# Patient Record
Sex: Male | Born: 1980
Health system: Southern US, Community
[De-identification: ages and names within clinical notes are randomized; demographics above are authoritative.]

## PROBLEM LIST (undated history)

## (undated) DIAGNOSIS — T7840XA Allergy, unspecified, initial encounter: Secondary | ICD-10-CM

## (undated) DIAGNOSIS — K469 Unspecified abdominal hernia without obstruction or gangrene: Secondary | ICD-10-CM

## (undated) HISTORY — PX: TONSILLECTOMY: SHX5217

## (undated) HISTORY — DX: Gilbert syndrome: E80.4

## (undated) HISTORY — PX: WISDOM TOOTH EXTRACTION: SHX21

## (undated) HISTORY — DX: Unspecified abdominal hernia without obstruction or gangrene: K46.9

## (undated) HISTORY — PX: TYMPANOSTOMY TUBE PLACEMENT: SHX32

## (undated) HISTORY — DX: Allergy, unspecified, initial encounter: T78.40XA

---

## 2004-02-07 HISTORY — PX: UVULECTOMY: SHX2631

## 2009-10-20 ENCOUNTER — Ambulatory Visit: Payer: Self-pay | Admitting: Family

## 2009-10-20 DIAGNOSIS — K4020 Bilateral inguinal hernia, without obstruction or gangrene, not specified as recurrent: Secondary | ICD-10-CM | POA: Insufficient documentation

## 2009-10-20 DIAGNOSIS — J309 Allergic rhinitis, unspecified: Secondary | ICD-10-CM | POA: Insufficient documentation

## 2009-10-25 ENCOUNTER — Encounter: Payer: Self-pay | Admitting: Family

## 2009-11-06 HISTORY — PX: HERNIA REPAIR: SHX51

## 2009-11-09 ENCOUNTER — Ambulatory Visit (HOSPITAL_COMMUNITY): Admission: RE | Admit: 2009-11-09 | Discharge: 2009-11-09 | Payer: Self-pay | Admitting: Surgery

## 2010-01-26 ENCOUNTER — Ambulatory Visit: Payer: Self-pay | Admitting: Family

## 2010-03-08 NOTE — Assessment & Plan Note (Signed)
Summary: NEW TO BE EST/MHF--Rm 4   Vital Signs:  Patient profile:   30 year old male Height:      74 inches Weight:      205.75 pounds BMI:     26.51 Temp:     98.3 degrees F oral Pulse rate:   72 / minute Pulse rhythm:   regular Resp:     16 per minute BP sitting:   122 / 78  (right arm) Cuff size:   large  Vitals Entered By: Mervin Kung CMA Duncan Dull) (October 20, 2009 1:54 PM) CC: Rm 4  New pt to establish care. Has had intermittent lower abdominal pressure x 2 weeks. Is Patient Diabetic? No Pain Assessment Patient in pain? yes     Location: lower abdomen Intensity: slight discomfort   CC:  Rm 4  New pt to establish care. Has had intermittent lower abdominal pressure x 2 weeks.Marland Kitchen  History of Present Illness: Jon Palmer is a 30 year old male who presents today to establish care. He was previoulsy followed by [facility], but wishes to transfer his care.  He has several concerns today.  1)Abdominal discomfort- Pt notes of lower abdominal pressure for 3-4 days.  Worse if laughing/coughing.  Denies dysuria, slight urinary frequency. Started back to the gym 2 months ago.  BM's have been 4x a day recently- normal BM's 2-3 x a day.  No black or bloody stools, No fever, no nausea or vomitting.    2)Allergic Rhinitis-  had allergy shots as a child.  Now takes flonase and zyrtec with good control.   Preventive Screening-Counseling & Management  Alcohol-Tobacco     Alcohol drinks/day: 1 glass wine every other day     Smoking Status: quit     Year Quit: 2010     Pack years: 9  Caffeine-Diet-Exercise     Caffeine use/day: 2 teas daily     Does Patient Exercise: yes     Type of exercise: cardio     Exercise (avg: min/session): 30-60     Times/week: 3  Allergies (verified): 1)  ! 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 Resp: Denies cough CV:  Denies Chest Pain or shortness of breath GI:  Denies nausea or vomitting GU: Denies dysuria Lymphatic: Denies lymphadenopathy Musculoskeletal:  Denies muscle/joint pain Skin:  Denies Rashes- has mole on his back- has been there for years Psychiatric: college- underwent therapy.   Neuro: Denies numbness      Physical Exam  General:  Well-developed,well-nourished,in no acute distress; alert,appropriate and cooperative throughout examination Lungs:  Normal respiratory effort, chest expands symmetrically. Lungs are clear to auscultation, no crackles or wheezes. Heart:  Normal rate and regular rhythm. S1 and S2 normal without gallop, murmur, click, rub or other extra sounds. Abdomen:  Mild abdominal tenderness to palpation noted on right mid abdomen.  No guarding, no rebound tenderness, + BP noted.  Abdomen is soft and non-distended (not acute abdomen) Genitalia:  bilateral inguinal hernia noted in inguinal canals bilaterally on exam with cough  L>R Psych:  Cognition and judgment appear intact. Alert and cooperative with normal attention span and concentration. No apparent delusions, illusions, hallucinations   Impression & Recommendations:  Problem # 1:  INGUINAL HERNIAS, BILATERAL (ICD-550.92) Assessment Comment Only  Will refer to surgery for further evaluation.  Pt was  instructed to avoid heavy lifing.  Call if worsening abdominal pain, nausea, vomitting or fever.    Orders: Surgical Referral (Surgery)  Problem # 2:  ALLERGIC RHINITIS (ICD-477.9) Assessment: Unchanged Refills provided on flonase, continue Zyrtec. His updated medication list for this problem includes:    Flonase 50 Mcg/act Susp (Fluticasone propionate) .Marland Kitchen... 1 spray each nostril at bedtime.    Zyrtec Allergy 10 Mg Tabs (Cetirizine hcl) .Marland Kitchen... Take 1 tablet by mouth once a day.  Complete Medication List: 1)  Flonase 50 Mcg/act Susp (Fluticasone propionate) .Marland Kitchen.. 1 spray each nostril at bedtime. 2)  Zyrtec Allergy 10 Mg Tabs (Cetirizine hcl) .... Take 1 tablet by mouth once a day.  Patient Instructions: 1)  You will be contacted about your referral to see the Surgeon. 2)  Call us if you develop worsening abdominal pain, nausea, vomitting, fever or malaise-  Go to ER if severe. 3)  Please schedule a follow up appointment for a complete physical, come fasting to this appointment. 4)  Welcome to [facility], it was a pleasure to meet you. Prescriptions: FLONASE 50 MCG/ACT SUSP (FLUTICASONE PROPIONATE) 1 spray each nostril at bedtime.  #1 x 5   Entered and Authorized by:   Lemont Fillers FNP   Signed by:   Lemont Fillers FNP on 10/20/2009   Method used:   Electronically to        [facility] # [phone]* (retail)       [address] [place], [place]  [address]       Ph: [phone]       Fax: [phone]   RxID:   [id number]   Current Allergies (reviewed today): ! PCN   Preventive Care Screening  Last Tetanus Booster:    Date:  02/07/2004    Results:  Historical      Pt states he will get flu shot at work. Nicki Guadalajara Fergerson CMA Duncan Dull)  October 20, 2009 2:07 PM

## 2010-03-08 NOTE — Consult Note (Signed)
Summary: [facility]   Imported By: Lanelle Bal 11/11/2009 08:09:12  _____________________________________________________________________  External Attachment:    Type:   Image     Comment:   External Document

## 2010-03-10 NOTE — Assessment & Plan Note (Signed)
Summary: hives/mhf--Rm 5   Vital Signs:  Patient profile:   30 year old male Height:      74 inches Weight:      214 pounds BMI:     27.58 Temp:     98.1 degrees F oral Pulse rate:   66 / minute Pulse rhythm:   regular Resp:     16 per minute BP sitting:   110 / 78  (right arm) Cuff size:   large  Vitals Entered By: Mervin Kung CMA Duncan Dull) (January 26, 2010 8:45 AM) CC: Pt states he has had red, itchy rash since yesterday. Is Patient Diabetic? No Pain Assessment Patient in pain? no      Comments Pt has tried Benadryl every 6 hours for rash. Nicki Guadalajara Fergerson CMA Duncan Dull)  January 26, 2010 8:51 AM    Primary Care Emiliya Chretien:  Jon Palmer  CC:  Pt states he has had red and itchy rash since yesterday.Marland Kitchen  History of Present Illness: Jon Palmer is a 30 year old male who presents with 48 hour history of hives.  He has continued zyrtec and used Benadryl which has improved itching, but has not "cleared up".  First noticed itching yesterday morning. He had chicken pot pie for dinner night before.  Had captain crunch for breakfast.  No new detergents or soap.  Denies associated SOB, tongue, lip swelling, throat swelling or wheezing.  Did have a bad nose bleed day before.  Was using [facility], and switched to aveeno for 2 weeks prior.  Denies known history of allergy to bee stings or food.  Only known allergy is benadryl.  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    Allergies: 1)  ! Pcn  Past History:  Past Medical History: Last updated: 10/20/2009 allergic rhinitis hernia as infant  Past Surgical History: Last updated: 10/20/2009 Tonsillectomy-during college uvulectomy 2006 wisdom teeth extraction hernia repair ear tubes as child  Review of Systems       see HPI  Physical Exam  General:  Well-developed,well-nourished,in no acute distress;  alert,appropriate and cooperative throughout examination Head:  Normocephalic and atraumatic without obvious abnormalities. No apparent alopecia or balding. Mouth:  Oral mucosa and oropharynx without lesions or exudates. No tongue or lip swelling  Teeth in good repair. Skin:  + urticarial rash noted on left side of neck and bilateral palmar surfaces.   Impression & Recommendations:  Problem # 1:  URTICARIA (ICD-708.9) Assessment New Pt given dose of solumedrol today in the office.  Will plan to treat with prednisone taper and benadryl.  No sign of respiratory compromise, tongue/lip swelling.   Pt was instructed to go to the ED if any of these symptoms occur.   Solumedrol up to 125mg  (Z6109) Admin of Therapeutic Inj  intramuscular or subcutaneous (60454)  Complete Medication List: 1)  Flonase 50 Mcg/act Susp (Fluticasone propionate) .Marland Kitchen.. 1 spray each nostril at bedtime. 2)  Zyrtec Allergy 10 Mg Tabs (Cetirizine hcl) .... Take 1 tablet by mouth once a day. 3)  Prednisone 10 Mg Tabs (Prednisone) .... Take 4 tablets daily x 2 days, then 3 tabs daily x 2 days, then 2 tabs daily x 2 days, then 1 tab daily for 2 days then stop. 4)  Diphenhydramine Hcl 25 Mg Caps (Diphenhydramine hcl) .... One tablet by mouth every 6 hours as needed for itching  Patient Instructions: 1)  Go to the ER if you develop lip, tongue swelling, shortness of breath. 2)  Call if your symptoms are not resolved in 48-72 hours.   Prescriptions: PREDNISONE 10 MG TABS (PREDNISONE) take 4 tablets daily x 2 days, then 3 tabs daily x 2 days, then 2 tabs daily x 2 days, then 1 tab daily for 2 days then stop.  #20 x 0   Entered and Authorized by:   Jon Palmer   Signed by:   Jon Palmer on 01/26/2010   Method used:   Electronically to        [facility] # [phone]* (retail)       [address] [place], [place]  [address]       Ph: [phone]       Fax: [phone]   RxID:    [id number]    Medication Administration  Injection # 1:    Medication: Solumedrol up to 125mg     Diagnosis: URTICARIA (ICD-708.9)    Route: IM    Site: LUOQ gluteus    Exp Date: 05/06/2012    Lot #: [id number]    Mfr: Pharmacia    Patient tolerated injection without complications    Given by: Mervin Kung CMA Duncan Dull) (January 26, 2010 9:20 AM)  Orders Added: 1)  Solumedrol up to 125mg  [J2930] 2)  Admin of Therapeutic Inj  intramuscular or subcutaneous [96372] 3)  Est. Patient Level III [24401]    Current Allergies (reviewed today): ! PCN

## 2010-03-29 ENCOUNTER — Encounter: Payer: Self-pay | Admitting: Family

## 2010-03-29 ENCOUNTER — Encounter (INDEPENDENT_AMBULATORY_CARE_PROVIDER_SITE_OTHER): Payer: BC Managed Care – PPO | Admitting: Family

## 2010-03-29 DIAGNOSIS — S239XXA Sprain of unspecified parts of thorax, initial encounter: Secondary | ICD-10-CM

## 2010-03-29 DIAGNOSIS — J309 Allergic rhinitis, unspecified: Secondary | ICD-10-CM

## 2010-03-29 DIAGNOSIS — Z0000 Encounter for general adult medical examination without abnormal findings: Secondary | ICD-10-CM

## 2010-03-29 LAB — CONVERTED CEMR LAB
Albumin: 5.1 g/dL (ref 3.5–5.2)
Alkaline Phosphatase: 66 units/L (ref 39–117)
Basophils Absolute: 0 10*3/uL (ref 0.0–0.1)
CO2: 27 meq/L (ref 19–32)
Chloride: 103 meq/L (ref 96–112)
Creatinine, Ser: 0.96 mg/dL (ref 0.40–1.50)
HDL: 59 mg/dL (ref >39)
HIV: NONREACTIVE
Hemoglobin: 14.5 g/dL (ref 13.0–17.0)
LDL Cholesterol: 94 mg/dL (ref 0–99)
Lymphocytes Relative: 45 % (ref 12–46)
Monocytes Absolute: 0.5 10*3/uL (ref 0.1–1.0)
Neutro Abs: 2.3 10*3/uL (ref 1.7–7.7)
RBC: 5.09 M/uL (ref 4.22–5.81)
RDW: 13.3 % (ref 11.5–15.5)
TSH: 1.523 microintl units/mL (ref 0.350–4.500)
Total Bilirubin: 1.5 mg/dL — ABNORMAL HIGH (ref 0.3–1.2)
Total CHOL/HDL Ratio: 2.8
WBC: 5.2 10*3/uL (ref 4.0–10.5)

## 2010-03-30 ENCOUNTER — Telehealth: Payer: Self-pay | Admitting: Family

## 2010-03-30 HISTORY — DX: Gilbert syndrome: E80.4
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
## 2010-04-05 NOTE — Progress Notes (Signed)
  Phone Note Outgoing Call   Summary of Call: Spoke with patient, reviewed lab work.  Pt instructed to go to lab in 1 month for repeat LFTs.  If bili still high, will plan additional work up. Initial call taken by: Lemont Fillers FNP,  March 30, 2010 10:21 AM  New Problems: HYPERBILIRUBINEMIA (ICD-782.4)   New Problems: HYPERBILIRUBINEMIA (ICD-782.4)

## 2010-04-21 LAB — DIFFERENTIAL
Basophils Absolute: 0 10*3/uL (ref 0.0–0.1)
Basophils Relative: 0 % (ref 0–1)
Eosinophils Relative: 1 % (ref 0–5)
Lymphocytes Relative: 44 % (ref 12–46)
Monocytes Absolute: 0.6 10*3/uL (ref 0.1–1.0)

## 2010-04-21 LAB — COMPREHENSIVE METABOLIC PANEL
AST: 30 U/L (ref 0–37)
Albumin: 4.2 g/dL (ref 3.5–5.2)
Alkaline Phosphatase: 58 U/L (ref 39–117)
Chloride: 104 mEq/L (ref 96–112)
GFR calc Af Amer: >60 mL/min (ref >60)
Potassium: 4.1 mEq/L (ref 3.5–5.1)
Total Bilirubin: 0.8 mg/dL (ref 0.3–1.2)

## 2010-04-21 LAB — CBC
Platelets: 192 10*3/uL (ref 150–400)
RBC: 4.93 MIL/uL (ref 4.22–5.81)
WBC: 5.7 10*3/uL (ref 4.0–10.5)

## 2010-05-11 ENCOUNTER — Encounter: Payer: Self-pay | Admitting: Family

## 2010-05-11 ENCOUNTER — Ambulatory Visit (INDEPENDENT_AMBULATORY_CARE_PROVIDER_SITE_OTHER): Payer: BC Managed Care – PPO | Admitting: Family

## 2010-05-11 ENCOUNTER — Ambulatory Visit (HOSPITAL_BASED_OUTPATIENT_CLINIC_OR_DEPARTMENT_OTHER)
Admission: RE | Admit: 2010-05-11 | Discharge: 2010-05-11 | Disposition: A | Discharge status: Home / Self Care | Payer: BC Managed Care – PPO | Source: Ambulatory Visit | Attending: Family | Admitting: Family

## 2010-05-11 VITALS — BP 94/68 | HR 62 | Temp 97.9°F | Resp 18 | Wt 215.0 lb

## 2010-05-11 DIAGNOSIS — S59909A Unspecified injury of unspecified elbow, initial encounter: Secondary | ICD-10-CM

## 2010-05-11 DIAGNOSIS — S6991XA Unspecified injury of right wrist, hand and finger(s), initial encounter: Secondary | ICD-10-CM | POA: Insufficient documentation

## 2010-05-11 DIAGNOSIS — S59919A Unspecified injury of unspecified forearm, initial encounter: Secondary | ICD-10-CM

## 2010-05-11 DIAGNOSIS — M25539 Pain in unspecified wrist: Secondary | ICD-10-CM | POA: Insufficient documentation

## 2010-05-11 NOTE — Progress Notes (Signed)
Pt notified and voices understanding. 

## 2010-05-11 NOTE — Patient Instructions (Signed)
Please complete your x-ray this afternoon on the first floor. We will call you with your results.

## 2010-05-11 NOTE — Assessment & Plan Note (Addendum)
X-ray performed today to rule out fracture.  Negative for fracture.  Sxs most consistent with wrist sprain.  Rec ACE wrap, ice, aleve PRN.
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
## 2010-06-08 ENCOUNTER — Ambulatory Visit (HOSPITAL_BASED_OUTPATIENT_CLINIC_OR_DEPARTMENT_OTHER)
Admission: RE | Admit: 2010-06-08 | Discharge: 2010-06-08 | Disposition: A | Discharge status: Home / Self Care | Payer: BC Managed Care – PPO | Source: Ambulatory Visit | Attending: Family | Admitting: Family

## 2010-06-08 ENCOUNTER — Telehealth: Payer: Self-pay | Admitting: Family

## 2010-06-08 ENCOUNTER — Encounter: Payer: Self-pay | Admitting: Family

## 2010-06-08 ENCOUNTER — Ambulatory Visit (INDEPENDENT_AMBULATORY_CARE_PROVIDER_SITE_OTHER): Payer: BC Managed Care – PPO | Admitting: Family

## 2010-06-08 VITALS — BP 118/70 | HR 72 | Temp 98.2°F | Resp 16 | Ht 74.02 in | Wt 219.0 lb

## 2010-06-08 DIAGNOSIS — R1031 Right lower quadrant pain: Secondary | ICD-10-CM

## 2010-06-08 DIAGNOSIS — R109 Unspecified abdominal pain: Secondary | ICD-10-CM | POA: Insufficient documentation

## 2010-06-08 MED ORDER — IOHEXOL 300 MG/ML  SOLN
100.0000 mL | Freq: Once | INTRAMUSCULAR | Status: AC | PRN
  Administered 2010-06-08: 100 mL via INTRAVENOUS

## 2010-06-08 NOTE — Progress Notes (Signed)
  Subjective:    Patient ID: Jon Palmer, male    DOB: 12-15-1980, 30 y.o.   MRN: [id number]  HPI  [name] is a 30 yr old male who presents with complaint of right lower quadrant pain since Friday.  Pain waxes and wanes.  Hx bilateral inguinal hernia repair in October.  Denies associated nausea, vomitting, constipation, diarrhea or fever.  He reports similar pain in the past prior to his hernia repair.     Review of Systems Past Medical History  Diagnosis Date  . Allergy     allergic rhinitis  . Hernia     as an infant    History   Social History  . Marital Status: Married    Spouse Name: N/A    Number of Children: 0  . 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 Smoking status: Former Games developer  . Smokeless tobacco: Not on file  . Alcohol Use: Yes     1-2 beers some days  . Drug Use: Not on file  . Sexually Active: Not on file   Other Topics Concern  . Not on file   Social History Narrative   regular exercise: yesMale partner    Past Surgical History  Procedure Date  . Tonsillectomy     during college  . Uvulectomy 2006  . Hernia repair 11/2009    bilateral  . Tympanostomy tube placement     as a child  . Wisdom tooth extraction     Family History  Problem Relation Age of Onset  . Hyperlipidemia Mother   . Hypertension Mother   . Hyperlipidemia Father   . Hypertension Father   . Cancer Father     bladder  . Heart disease Maternal Grandmother     multiple heart bypasses  . Heart attack Maternal Grandfather   . Heart disease Paternal Grandmother     multiple heart bypasses    Allergies  Allergen Reactions  . Penicillins     REACTION: hives    Current Outpatient Prescriptions on File Prior to Visit  Medication Sig Dispense Refill  . cetirizine (ZYRTEC) 10 MG tablet Take 10 mg by mouth daily.        . fluticasone (FLONASE) 50 MCG/ACT nasal spray 2 sprays by Each Nare  route at bedtime.        . naproxen sodium (ALEVE) 220 MG tablet Take 220 mg by mouth 2 (two) times daily with a meal. As needed for back pain         BP 118/70  Pulse 72  Temp(Src) 98.2 F (36.8 C) (Oral)  Resp 16  Ht 6' 2.02" (1.88 m)  Wt 219 lb 0.6 oz (99.356 kg)  BMI 28.11 kg/m2       Objective:   Physical Exam  Constitutional: He appears well-developed and well-nourished.  Cardiovascular: Normal rate and regular rhythm.   Pulmonary/Chest: Effort normal and breath sounds normal.  Abdominal: Soft. He exhibits no mass. There is no tenderness.  Genitourinary: Penis normal.       No palpable inguinal hernias.  Skin: Skin is warm and dry.          Assessment & Plan:

## 2010-06-08 NOTE — Telephone Encounter (Signed)
Called patient and reviewed results of CT scan.

## 2010-06-08 NOTE — Assessment & Plan Note (Signed)
29 yr old male presents with RLQ abdominal pain, s/p bilateral IHR 6 mos ago.  Need to exclude recurrent hernia or appendicitis.  Will refer for CT abdomen.

## 2010-06-08 NOTE — Patient Instructions (Signed)
Please complete your CT on the first floor today- we will contact you with the results.

## 2010-10-21 ENCOUNTER — Telehealth: Payer: Self-pay | Admitting: Family

## 2010-10-21 NOTE — Telephone Encounter (Signed)
Error

## 2010-10-26 ENCOUNTER — Telehealth: Payer: Self-pay | Admitting: Family

## 2010-10-26 MED ORDER — FLUTICASONE PROPIONATE 50 MCG/ACT NA SUSP: 2.0000 | Freq: Every day | NASAL | Status: DC

## 2010-10-26 NOTE — Telephone Encounter (Signed)
Refill- fluticasone prop spray. Use one spray in each nostril at bedtime. Qty 16.0gm. Last fill 4.1.12

## 2010-10-26 NOTE — Telephone Encounter (Signed)
Refill sent to pharmacy.   

## 2011-01-17 ENCOUNTER — Other Ambulatory Visit: Payer: Self-pay | Admitting: Family

## 2011-05-08 ENCOUNTER — Encounter: Payer: Self-pay | Admitting: Family

## 2011-05-08 ENCOUNTER — Ambulatory Visit (INDEPENDENT_AMBULATORY_CARE_PROVIDER_SITE_OTHER): Payer: BC Managed Care – PPO | Admitting: Family

## 2011-05-08 VITALS — BP 114/64 | HR 73 | Temp 97.9°F | Resp 16 | Ht 74.0 in | Wt 219.0 lb

## 2011-05-08 DIAGNOSIS — R1030 Lower abdominal pain, unspecified: Secondary | ICD-10-CM

## 2011-05-08 DIAGNOSIS — R109 Unspecified abdominal pain: Secondary | ICD-10-CM

## 2011-05-08 DIAGNOSIS — N419 Inflammatory disease of prostate, unspecified: Secondary | ICD-10-CM | POA: Insufficient documentation

## 2011-05-08 MED ORDER — FLUTICASONE PROPIONATE 50 MCG/ACT NA SUSP: 2.0000 | Freq: Every day | NASAL | Status: DC

## 2011-05-08 NOTE — Progress Notes (Signed)
Subjective:    Patient ID: Jon Palmer, male    DOB: 02-08-1980, 30 y.o.   MRN: [id number]  HPI  [name] is a 31 yr old male who presents with complaint of bilateral groin pain. He has hx of bilateral inguinal hernia repair in 2011. He denies problems with nausea/vomitting/abdominal pain. BM's are reported as normal.  Pain is worst with sitting and with leaning forward.  Symptoms started acting back up again a few months ago. Denies any masses or notable bulging.   Review of Systems See HPI  Past Medical History  Diagnosis Date  . Allergy     allergic rhinitis  . Hernia     as an infant    History   Social History  . Marital Status: Married    Spouse Name: N/A    Number of Children: 0  . 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 Smoking status: Former Games developer  . Smokeless tobacco: Not on file  . Alcohol Use: Yes     1-2 beers some days  . Drug Use: Not on file  . Sexually Active: Not on file   Other Topics Concern  . Not on file   Social History Narrative   regular exercise: yesMale partner    Past Surgical History  Procedure Date  . Tonsillectomy     during college  . Uvulectomy 2006  . Hernia repair 11/2009    bilateral  . Tympanostomy tube placement     as a child  . Wisdom tooth extraction     Family History  Problem Relation Age of Onset  . Hyperlipidemia Mother   . Hypertension Mother   . Hyperlipidemia Father   . Hypertension Father   . Cancer Father     bladder  . Heart disease Maternal Grandmother     multiple heart bypasses  . Heart attack Maternal Grandfather   . Heart disease Paternal Grandmother     multiple heart bypasses    Allergies  Allergen Reactions  . Penicillins     REACTION: hives    Current Outpatient Prescriptions on File Prior to Visit  Medication Sig Dispense Refill  . cetirizine (ZYRTEC) 10 MG tablet Take 10 mg by mouth daily.          Marland Kitchen DISCONTD: fluticasone (FLONASE) 50 MCG/ACT nasal spray PLACE 2 SPRAYS INTO THE NOSE AT BEDTIME.  16 g  1  . naproxen sodium (ALEVE) 220 MG tablet Take 220 mg by mouth 2 (two) times daily with a meal. As needed for back pain         BP 114/64  Pulse 73  Temp(Src) 97.9 F (36.6 C) (Oral)  Resp 16  Ht 6\' 2"  (1.88 m)  Wt 219 lb (99.338 kg)  BMI 28.12 kg/m2  SpO2 99%       Objective:   Physical Exam  Constitutional: He appears well-developed and well-nourished.  Cardiovascular: Normal rate and regular rhythm.   No murmur heard. Pulmonary/Chest: Effort normal and breath sounds normal. No respiratory distress. He has no wheezes. He has no rales. He exhibits no tenderness.  Abdominal: Soft. Bowel sounds are normal. He exhibits no distension.         Mild left groin tenderness to palpation.   Psychiatric: He has a normal mood and affect. His behavior is normal. Judgment and thought content normal. Cognition and memory are normal.  Assessment & Plan:

## 2011-05-08 NOTE — Assessment & Plan Note (Signed)
Will refer back to Dr. Mignon Pine, surgeon who preformed his hernia repair in 2011 for re-evaluation.  We discussed signs/symptoms or incarcerated hernia and he is instructed to go to the ED if they occurs.

## 2011-05-08 NOTE — Patient Instructions (Signed)
You will be contacted about your referral to Dr. Mignon Pine.  Please schedule a fasting physical.

## 2011-05-17 ENCOUNTER — Ambulatory Visit (INDEPENDENT_AMBULATORY_CARE_PROVIDER_SITE_OTHER): Payer: BC Managed Care – PPO | Admitting: Family

## 2011-05-17 ENCOUNTER — Encounter: Payer: Self-pay | Admitting: Family

## 2011-05-17 ENCOUNTER — Ambulatory Visit (HOSPITAL_BASED_OUTPATIENT_CLINIC_OR_DEPARTMENT_OTHER)
Admission: RE | Admit: 2011-05-17 | Discharge: 2011-05-17 | Disposition: A | Discharge status: Home / Self Care | Payer: BC Managed Care – PPO | Source: Ambulatory Visit | Attending: Family | Admitting: Family

## 2011-05-17 ENCOUNTER — Telehealth: Payer: Self-pay | Admitting: Family

## 2011-05-17 VITALS — BP 130/80 | HR 88 | Temp 97.9°F | Resp 16 | Wt 217.0 lb

## 2011-05-17 DIAGNOSIS — R109 Unspecified abdominal pain: Secondary | ICD-10-CM

## 2011-05-17 DIAGNOSIS — R1030 Lower abdominal pain, unspecified: Secondary | ICD-10-CM

## 2011-05-17 DIAGNOSIS — N419 Inflammatory disease of prostate, unspecified: Secondary | ICD-10-CM

## 2011-05-17 DIAGNOSIS — Z9889 Other specified postprocedural states: Secondary | ICD-10-CM

## 2011-05-17 DIAGNOSIS — R300 Dysuria: Secondary | ICD-10-CM

## 2011-05-17 DIAGNOSIS — N289 Disorder of kidney and ureter, unspecified: Secondary | ICD-10-CM

## 2011-05-17 LAB — POCT URINALYSIS DIPSTICK
Bilirubin, UA: NEGATIVE
Glucose, UA: NEGATIVE
Ketones, UA: NEGATIVE
Leukocytes, UA: NEGATIVE
Nitrite, UA: NEGATIVE
Protein, UA: NEGATIVE
Spec Grav, UA: 1.015
Urobilinogen, UA: 0.2
pH, UA: 7

## 2011-05-17 MED ORDER — IOHEXOL 300 MG/ML  SOLN: 100.0000 mL | Freq: Once | INTRAMUSCULAR | Status: AC | PRN

## 2011-05-17 MED ORDER — CIPROFLOXACIN HCL 500 MG PO TABS: 500.0000 mg | ORAL_TABLET | Freq: Two times a day (BID) | ORAL | Status: AC

## 2011-05-17 NOTE — Progress Notes (Signed)
Subjective:    Patient ID: Jon Palmer, male    DOB: Apr 08, 1980, 31 y.o.   MRN: [id number]  HPI  Jon Palmer is a 31 yr old male who presents today for follow up of his groin pain.  He was initially evaluated on 05/08/11 due to this discomfort.  A referral was made back to his surgeon who performed his inguinal hernia repair in 2011.  He is scheduled to meet with Dr. Mignon Pine on 4/18.  Since his last visit, he reports that the pain has worsened and become constant.  Reports pain in the groin- at the top of the legs.  L>R.  Pain radiates up both sides of his abdomen.  In addition, he reports that he has developed nausea.  Stools have been soft and more frequent than normal.   Review of Systems See HPI  Past Medical History  Diagnosis Date  . Allergy     allergic rhinitis  . Hernia     as an infant    History   Social History  . Marital Status: Married    Spouse Name: N/A    Number of Children: 0  . 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 Smoking status: Former Games developer  . Smokeless tobacco: Not on file  . Alcohol Use: Yes     1-2 beers some days  . Drug Use: Not on file  . Sexually Active: Not on file   Other Topics Concern  . Not on file   Social History Narrative   regular exercise: yesMale partner    Past Surgical History  Procedure Date  . Tonsillectomy     during college  . Uvulectomy 2006  . Hernia repair 11/2009    bilateral  . Tympanostomy tube placement     as a child  . Wisdom tooth extraction     Family History  Problem Relation Age of Onset  . Hyperlipidemia Mother   . Hypertension Mother   . Hyperlipidemia Father   . Hypertension Father   . Cancer Father     bladder  . Heart disease Maternal Grandmother     multiple heart bypasses  . Heart attack Maternal Grandfather   . Heart disease Paternal Grandmother     multiple heart bypasses    Allergies  Allergen  Reactions  . Penicillins     REACTION: hives    Current Outpatient Prescriptions on File Prior to Visit  Medication Sig Dispense Refill  . cetirizine (ZYRTEC) 10 MG tablet Take 10 mg by mouth daily.        . fluticasone (FLONASE) 50 MCG/ACT nasal spray Place 2 sprays into the nose daily.  16 g  3  . naproxen sodium (ALEVE) 220 MG tablet Take 220 mg by mouth 2 (two) times daily with a meal. As needed for back pain         BP 130/80  Pulse 88  Temp(Src) 97.9 F (36.6 C) (Oral)  Resp 16  Wt 217 lb 0.6 oz (98.449 kg)  SpO2 99%       Objective:   Physical Exam  Constitutional: He appears well-developed and well-nourished. No distress.  Cardiovascular: Normal rate and regular rhythm.   No murmur heard. Pulmonary/Chest: Effort normal and breath sounds normal. No respiratory distress. He has no wheezes. He has no rales. He exhibits no tenderness.  Abdominal: Hernia confirmed negative in the right inguinal  area and confirmed negative in the left inguinal area.  Genitourinary: Penis normal. Circumcised.  Lymphadenopathy:       Right: No inguinal adenopathy present.       Left: No inguinal adenopathy present.  Psychiatric:       Pleasant, tearful briefly during interview upon discussing recent stress.           Assessment & Plan:

## 2011-05-17 NOTE — Patient Instructions (Addendum)
Prostatitis Prostatitis is an inflammation (the body's way of reacting to injury and/or infection) of the prostate gland. The prostate gland is a male organ. The gland is about the size and shape of a walnut. The prostate is located just below the bladder. It produces semen, which is a fluid that helps nourish and transport sperm. Prostatitis is the most common urinary tract problem in men younger than age 31. There are 4 categories of prostatitis:  I - Acute bacterial prostatitis.   II - Chronic bacterial prostatitis.   III - Chronic prostatitis and chronic pelvic pain syndrome (CPPS).   Inflammatory.   Non inflammatory.   IV - Asymptomatic inflammatory prostatitis.  Acute and chronic bacterial prostatitis are problems with bacterial infections of the prostate. "Acute" infection is usually a one-time problem. "Chronic" bacterial prostatitis is a condition with recurrent infection. It is usually caused by the same germ(bacteria). CPPS has symptoms similar to prostate infection. However, no infection is actually found. This condition can cause problems of ongoing pain. Currently, it cannot be cured. Treatments are available and aimed at symptom control.  Asymptomatic inflammatory prostatitis has no symptoms. It is a condition where infection-fighting cells are found by chance in the urine. The diagnosis is made most often during an exam for other conditions. Other conditions could be infertility or a high level of PSA (prostate-specific antigen) in the blood. SYMPTOMS  Symptoms can vary depending upon the type of prostatitis that exists. There can also be overlap in symptoms. This can make diagnosis difficult. Symptoms: For Acute bacterial prostatitis  Painful urination.   Fever or chills.   Muscle or joint pains.   Low back pain.   Low abdominal pain.   Inability to empty bladder completely.   Sudden urges to urinate.   Frequent urination during the day.   Difficulty starting  urine stream.   Need to urinate several times at night (nocturia).   Weak urine stream.   Urethral (tube that carries urine from the bladder out of the body) discharge and dribbling after urination.  For Chronic bacterial prostatitis  Rectal pain.   Pain in the testicles, penis, or tip of the penis.   Pain in the space between the anus and scrotum (perineum).   Low back pain.   Low abdominal pain.   Problems with sexual function.   Painful ejaculation.   Bloody semen.   Inability to empty bladder completely.   Painful urination.   Sudden urges to urinate.   Frequent urination during the day.   Difficulty starting urine stream.   Need to urinate several times at night (nocturia).   Weak urine stream.   Dribbling after urination.   Urethral discharge.  For Chronic prostatitis and chronic pelvic pain syndrome (CPPS) Symptoms are the same as those for chronic bacterial prostatitis. Problems with sexual function are often the reason for seeking care. This important problem should be discussed with your caregiver. For Asymptomatic inflammatory prostatitis As noted above, there are no symptoms with this condition. DIAGNOSIS   Your caregiver may perform a rectal exam. This exam is to determine if the prostate is swollen and tender.   Sometimes blood work is performed. This is done to see if your white blood cell count is elevated. The Prostate Specific Antigen (PSA) is also measured. PSA is a blood test that can help detect early prostate cancer.   A urinalysis is done to find out what type of infection is present if this is a suspected cause. An   additional urinalysis may be done after a digital rectal exam. This is to see if white blood cells are pushed out of the prostate and into the urine. A low-grade infection of the prostate may not be found on the first urinalysis.  In more difficult cases, your caregiver may advise other tests. Tests could include:  Urodynamics  -- Tests the function of the bladder and the organs involved in triggering and controlling normal urination.   Urine flow rate.   Cystoscopy -- In this procedure, a thin, telescope-like tube with a light and tiny camera attached (cystoscope) is inserted into the bladder through the urethra. This allows the caregiver to see the inside of the urethra and bladder.   Electromyography -- This procedure tests how the muscles and nerves of the bladder work. It is focused on the muscles that control the anus and pelvic floor. These are the muscles between the anus and scrotum.  In people who show no signs of infection, certain uncommon infections might be causing constant or recurrent symptoms. These uncommon infections are difficult to detect. More work in medicine may help find solutions to these problems. TREATMENT  Antibiotics are used to treat infections caused by germs. If the infection is not treated and becomes long lasting (chronic), it may become a lower grade infection with minor, continual problems. Without treatment, the prostate may develop a boil or furuncle (abscess). This may require surgical treatment. For those with chronic prostatitis and CPPS, it is important to work closely with your primary caregiver and urologist. For some, the medicines that are used to treat a non-cancerous, enlarged prostate (benign prostatic hypertrophy) may be helpful. Referrals to specialists other than urologists may be necessary. In rare cases when all treatments have been inadequate for pain control, an operation to remove the prostate may be recommended. This is very rare and before this is considered thorough discussion with your urologist is highly recommended.  In cases of secondary to chronic non-bacterial prostatitis, a good relationship with your urologist or primary caregiver is essential because it is often a recurrent prolonged condition that requires a good understanding of the causes and a commitment  to therapy aimed at controlling your symptoms. HOME CARE INSTRUCTIONS   Hot sitz baths for 20 minutes, 4 times per day, may help relieve pain.   Non-prescription pain killers may be used as your caregiver recommends if you have no allergies to them. Some illnesses or conditions prevent use of non-prescription drugs. If unsure, check with your caregiver. Take all medications as directed. Take the antibiotics for the prescribed length of time, even if you are feeling better.  SEEK MEDICAL CARE IF:   You have any worsening of the symptoms that originally brought you to your caregiver.   You have an oral temperature above 102 F (38.9 C).   You experience any side effects from medications prescribed.  SEEK IMMEDIATE MEDICAL CARE IF:   You have an oral temperature above 102 F (38.9 C), not controlled by medicine.   You have pain not relieved with medications.   You develop nausea, vomiting, lightheadedness, or have a fainting episode.   You are unable to urinate.   You pass bloody urine or clots.  Document Released: 01/21/2000 Document Revised: 01/12/2011 Document Reviewed: 12/26/2010 [employer] Patient Information 2012 [employer]. 

## 2011-05-17 NOTE — Telephone Encounter (Signed)
Lab will be unable to add GC/chlamydia to urine culture. Will call pt to return to the lab for additional specimen.

## 2011-05-18 LAB — URINE CULTURE

## 2011-05-18 LAB — GC/CHLAMYDIA PROBE AMP, URINE: Chlamydia, Swab/Urine, PCR: NEGATIVE

## 2011-05-18 NOTE — Assessment & Plan Note (Signed)
Due to ongoing complaints of lower abdominal discomfort and now nausea, a CT abd/pelvis was obtained and was unremarkable.  Specifically, it is neg for inguinal hernias.  I suspect prostatitis.  GC/Chlamydia is neg.  Pt is pen allergic.  Will plan to treat with cipro.

## 2011-05-19 ENCOUNTER — Encounter: Payer: Self-pay | Admitting: Family

## 2011-05-22 ENCOUNTER — Encounter: Payer: Self-pay | Admitting: Family

## 2011-05-22 ENCOUNTER — Encounter (INDEPENDENT_AMBULATORY_CARE_PROVIDER_SITE_OTHER): Payer: Self-pay | Admitting: Surgery

## 2011-05-22 ENCOUNTER — Ambulatory Visit (INDEPENDENT_AMBULATORY_CARE_PROVIDER_SITE_OTHER): Payer: BC Managed Care – PPO | Admitting: Family

## 2011-05-22 DIAGNOSIS — K5900 Constipation, unspecified: Secondary | ICD-10-CM | POA: Insufficient documentation

## 2011-05-22 DIAGNOSIS — N419 Inflammatory disease of prostate, unspecified: Secondary | ICD-10-CM

## 2011-05-22 NOTE — Assessment & Plan Note (Signed)
Clinically improving.  Continue Cipro.  Plan to send UA with reflex next visit as he was noted to hematuria on manual dip last visit.

## 2011-05-22 NOTE — Assessment & Plan Note (Addendum)
Seems to be improving.  I recommended high fiber diet, plenty of water. OK to repeat MOM if needed. Call if symptoms worsen, or if they do not continue to improve.  Recent neg CT abdomen is reassuring as well.

## 2011-05-22 NOTE — Progress Notes (Signed)
Subjective:    Patient ID: Jon Palmer, male    DOB: Jan 25, 1981, 31 y.o.   MRN: [id number]  HPI  Jon Palmer is a 31 yr old male who presents today with chief complaint of abdominal bloating.  He reports constipation which started on Saturday 4/13.  He reports that he had a small BM that day.  Sunday he had abdominal bloating, gas/belching and a small amount of soft stool.  Yesterday he took MOM.  Today he had a decent BM, but still having some abdominal cramping.  Denies vomitting. He is eating out of habit and tolerating the food, though he notes that he does not feel very hungry. Still feels bloated.  Stayed fairly inactive this weekend.  Groin pain- resolved. He continues cipro for prostatitis.  Review of Systems See HPI  Past Medical History  Diagnosis Date  . Allergy     allergic rhinitis  . Hernia     as an infant    History   Social History  . Marital Status: Married    Spouse Name: N/A    Number of Children: 0  . 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 Smoking status: Former Games developer  . Smokeless tobacco: Not on file  . Alcohol Use: Yes     1-2 beers some days  . Drug Use: Not on file  . Sexually Active: Not on file   Other Topics Concern  . Not on file   Social History Narrative   regular exercise: yesMale partner    Past Surgical History  Procedure Date  . Tonsillectomy     during college  . Uvulectomy 2006  . Hernia repair 11/2009    bilateral  . Tympanostomy tube placement     as a child  . Wisdom tooth extraction     Family History  Problem Relation Age of Onset  . Hyperlipidemia Mother   . Hypertension Mother   . Hyperlipidemia Father   . Hypertension Father   . Cancer Father     bladder  . Heart disease Maternal Grandmother     multiple heart bypasses  . Heart attack Maternal Grandfather   . Heart disease Paternal Grandmother     multiple heart bypasses     Allergies  Allergen Reactions  . Penicillins     REACTION: hives    Current Outpatient Prescriptions on File Prior to Visit  Medication Sig Dispense Refill  . cetirizine (ZYRTEC) 10 MG tablet Take 10 mg by mouth daily.        . ciprofloxacin (CIPRO) 500 MG tablet Take 1 tablet (500 mg total) by mouth 2 (two) times daily.  14 tablet  0  . fluticasone (FLONASE) 50 MCG/ACT nasal spray Place 2 sprays into the nose daily.  16 g  3  . naproxen sodium (ALEVE) 220 MG tablet Take 220 mg by mouth 2 (two) times daily with a meal. As needed for back pain         BP 118/80  Pulse 75  Temp(Src) 98 F (36.7 C) (Oral)  Resp 16  Wt 216 lb (97.977 kg)  SpO2 99%       Objective:   Physical Exam  Constitutional: He appears well-developed and well-nourished. No distress.  Cardiovascular: Normal rate and regular rhythm.   No murmur heard. Pulmonary/Chest: Effort normal and breath sounds normal. No respiratory distress. He has no wheezes.  He has no rales. He exhibits no tenderness.  Abdominal: Soft. Bowel sounds are normal. He exhibits no distension and no mass. There is no tenderness. There is no rebound and no guarding.  Psychiatric: He has a normal mood and affect. His behavior is normal. Judgment and thought content normal.          Assessment & Plan:

## 2011-05-22 NOTE — Patient Instructions (Signed)
Eat lots of fresh fruits/veggies. Make sure you are drinking 64 oz of water a day. Stay active.  Call if symptoms worsen, or if they do not continue to improve. Follow up as scheduled.

## 2011-05-26 ENCOUNTER — Encounter (INDEPENDENT_AMBULATORY_CARE_PROVIDER_SITE_OTHER): Payer: Self-pay | Admitting: Surgery

## 2011-05-31 ENCOUNTER — Ambulatory Visit (INDEPENDENT_AMBULATORY_CARE_PROVIDER_SITE_OTHER): Payer: BC Managed Care – PPO | Admitting: Family

## 2011-05-31 ENCOUNTER — Telehealth: Payer: Self-pay | Admitting: Family

## 2011-05-31 ENCOUNTER — Encounter: Payer: Self-pay | Admitting: Family

## 2011-05-31 DIAGNOSIS — R3129 Other microscopic hematuria: Secondary | ICD-10-CM

## 2011-05-31 DIAGNOSIS — N419 Inflammatory disease of prostate, unspecified: Secondary | ICD-10-CM

## 2011-05-31 DIAGNOSIS — R319 Hematuria, unspecified: Secondary | ICD-10-CM

## 2011-05-31 LAB — POCT URINALYSIS DIPSTICK
Blood, UA: NEGATIVE
Glucose, UA: NEGATIVE
Ketones, UA: NEGATIVE
Spec Grav, UA: >=1.03
Urobilinogen, UA: 0.2

## 2011-05-31 NOTE — Progress Notes (Signed)
  Subjective:    Patient ID: MARVENS HOLLARS, male    DOB: 03/18/1980, 31 y.o.   MRN: [id number]  HPI  Mr. Petrucelli is a 31 yr old male who presents today for follow up of his prostatitis.  He reports that he has completed cipro.  Reports resolution of groin pain and bloating.  Denies frequency, dysuria or hematuria.   Review of Systems See HPI     Objective:   Physical Exam  Constitutional: He appears well-developed and well-nourished. No distress.  Cardiovascular: Normal rate and regular rhythm.   No murmur heard. Pulmonary/Chest: Effort normal and breath sounds normal. No respiratory distress. He has no wheezes. He has no rales. He exhibits no tenderness.  Abdominal: Soft. Bowel sounds are normal. He exhibits no distension and no mass. There is no tenderness. There is no rebound and no guarding.  Psychiatric: He has a normal mood and affect. His behavior is normal. Judgment and thought content normal.          Assessment & Plan:

## 2011-05-31 NOTE — Assessment & Plan Note (Signed)
Clinically resolved s/p treatment.

## 2011-05-31 NOTE — Assessment & Plan Note (Signed)
UA is negative today for blood. Likely was related to prostatitis.

## 2011-05-31 NOTE — Patient Instructions (Signed)
Follow up as needed

## 2011-06-15 ENCOUNTER — Encounter: Payer: Self-pay | Admitting: Family

## 2011-06-15 NOTE — Telephone Encounter (Signed)
Jon Palmer, [facility] states they never received a urine from [place] on 05/31/11 but I remember sending it.  Please advise.

## 2011-08-03 ENCOUNTER — Encounter: Payer: Self-pay | Admitting: Internal Medicine

## 2011-08-03 ENCOUNTER — Ambulatory Visit (INDEPENDENT_AMBULATORY_CARE_PROVIDER_SITE_OTHER): Payer: BC Managed Care – PPO | Admitting: Internal Medicine

## 2011-08-03 VITALS — BP 102/70 | HR 81 | Temp 97.9°F | Resp 18 | Ht 74.0 in | Wt 216.0 lb

## 2011-08-03 DIAGNOSIS — J40 Bronchitis, not specified as acute or chronic: Secondary | ICD-10-CM

## 2011-08-03 MED ORDER — LEVOFLOXACIN 500 MG PO TABS: 500.0000 mg | ORAL_TABLET | Freq: Every day | ORAL | Status: AC

## 2011-08-03 NOTE — Progress Notes (Signed)
  Subjective:    Patient ID: Jon Palmer, male    DOB: March 21, 1980, 31 y.o.   MRN: [id number]  HPI Pt presents to clinic for evaluation of URI sx's. Notes two week h/o ST, NP cough, nasal drainage and ha's. Taking advil/dayquil prn. No alleviating or exacerbating factors. +sick exposure. No f/c or dyspnea/wheeze.   Past Medical History  Diagnosis Date  . Allergy     allergic rhinitis  . Hernia     as an infant   Past Surgical History  Procedure Date  . Tonsillectomy     during college  . Uvulectomy 2006  . Hernia repair 11/2009    bilateral  . Tympanostomy tube placement     as a child  . Wisdom tooth extraction     reports that he has quit smoking. He has never used smokeless tobacco. He reports that he drinks alcohol. His drug history not on file. family history includes Cancer in his father; Heart attack in his maternal grandfather; Heart disease in his maternal grandmother and paternal grandmother; Hyperlipidemia in his father and mother; and Hypertension in his father and mother. Allergies  Allergen Reactions  . Penicillins     REACTION: hives     Review of Systems see hpi    Objective:   Physical Exam  Nursing note and vitals reviewed. Constitutional: He appears well-developed and well-nourished. No distress.  HENT:  Head: Normocephalic and atraumatic.  Right Ear: Tympanic membrane, external ear and ear canal normal.  Left Ear: Tympanic membrane, external ear and ear canal normal.  Nose: Nose normal.  Mouth/Throat: Oropharynx is clear and moist. No oropharyngeal exudate.  Eyes: No scleral icterus.  Neck: Neck supple.  Pulmonary/Chest: Effort normal and breath sounds normal. No respiratory distress. He has no wheezes. He has no rales.  Lymphadenopathy:    He has no cervical adenopathy.  Neurological: He is alert.  Skin: Skin is warm and dry. He is not diaphoretic.  Psychiatric: He has a normal mood and affect.          Assessment & Plan:

## 2011-08-03 NOTE — Assessment & Plan Note (Signed)
Begin abx course. Followup if no improvement or worsening. 

## 2011-10-06 ENCOUNTER — Ambulatory Visit (INDEPENDENT_AMBULATORY_CARE_PROVIDER_SITE_OTHER): Payer: BC Managed Care – PPO | Admitting: Family

## 2011-10-06 ENCOUNTER — Encounter: Payer: Self-pay | Admitting: Family

## 2011-10-06 VITALS — BP 112/76 | HR 78 | Temp 98.4°F | Resp 16 | Ht 73.0 in | Wt 214.0 lb

## 2011-10-06 DIAGNOSIS — Z0000 Encounter for general adult medical examination without abnormal findings: Secondary | ICD-10-CM | POA: Insufficient documentation

## 2011-10-06 LAB — HEPATIC FUNCTION PANEL
ALT: 23 U/L (ref 0–53)
AST: 23 U/L (ref 0–37)
Bilirubin, Direct: 0.3 mg/dL (ref 0.0–0.3)
Indirect Bilirubin: 1.2 mg/dL — ABNORMAL HIGH (ref 0.0–0.9)

## 2011-10-06 LAB — BASIC METABOLIC PANEL WITH GFR
CO2: 25 mEq/L (ref 19–32)
Calcium: 9.6 mg/dL (ref 8.4–10.5)
Glucose, Bld: 85 mg/dL (ref 70–99)
Sodium: 139 mEq/L (ref 135–145)

## 2011-10-06 LAB — LIPID PANEL
Cholesterol: 195 mg/dL (ref 0–200)
LDL Cholesterol: 133 mg/dL — ABNORMAL HIGH (ref 0–99)
VLDL: 11 mg/dL (ref 0–40)

## 2011-10-06 LAB — CBC WITH DIFFERENTIAL/PLATELET
Lymphocytes Relative: 44 % (ref 12–46)
Lymphs Abs: 2.3 10*3/uL (ref 0.7–4.0)
Neutro Abs: 2.3 10*3/uL (ref 1.7–7.7)
Neutrophils Relative %: 45 % (ref 43–77)
Platelets: 235 10*3/uL (ref 150–400)
RBC: 4.76 MIL/uL (ref 4.22–5.81)
WBC: 5.2 10*3/uL (ref 4.0–10.5)

## 2011-10-06 NOTE — Assessment & Plan Note (Signed)
Pt encouraged to continue healthy diet, exercise.  Obtain fasting labs including UA due to frequency.  Immunizations reviewed and up to date.

## 2011-10-06 NOTE — Progress Notes (Signed)
Subjective:    Patient ID: Jon Palmer, male    DOB: Apr 08, 1980, 31 y.o.   MRN: [id number]  HPI  Preventative- + urinary frequency.   Has been doing the elliptical/weight training regularly and feels better. He has lost 8 pounds this month.  Dies is healthy.  He cooks a lot at home.   Review of Systems  Constitutional: Negative for unexpected weight change.  HENT: Positive for postnasal drip. Negative for hearing loss.   Eyes: Negative for visual disturbance.  Respiratory: Negative for cough and shortness of breath.   Cardiovascular: Negative for chest pain and leg swelling.  Gastrointestinal: Negative for nausea, vomiting, diarrhea and constipation.  Genitourinary: Positive for frequency.  Musculoskeletal: Negative for joint swelling and arthralgias.  Skin: Negative for rash.  Neurological: Negative for headaches.  Hematological: Negative for adenopathy.  Psychiatric/Behavioral:       Denies depression/anxiety   Past Medical History  Diagnosis Date  . Allergy     allergic rhinitis  . Hernia     as an infant    History   Social History  . Marital Status: Married    Spouse Name: N/A    Number of Children: 0  . 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 Smoking status: Former Games developer  . Smokeless tobacco: Never Used  . Alcohol Use: Yes     1-2 beers some days  . Drug Use: Not on file  . Sexually Active: Not on file   Other Topics Concern  . Not on file   Social History Narrative   regular exercise: yesMale partner    Past Surgical History  Procedure Date  . Tonsillectomy     during college  . Uvulectomy 2006  . Hernia repair 11/2009    bilateral  . Tympanostomy tube placement     as a child  . Wisdom tooth extraction     Family History  Problem Relation Age of Onset  . Hyperlipidemia Mother   . Hypertension Mother   . Hyperlipidemia Father   . Hypertension Father   .  Cancer Father     bladder  . Heart disease Maternal Grandmother     multiple heart bypasses  . Heart attack Maternal Grandfather   . Heart disease Paternal Grandmother     multiple heart bypasses    Allergies  Allergen Reactions  . Penicillins     REACTION: hives    Current Outpatient Prescriptions on File Prior to Visit  Medication Sig Dispense Refill  . fluticasone (FLONASE) 50 MCG/ACT nasal spray Place 2 sprays into the nose daily.  16 g  3  . cetirizine (ZYRTEC) 10 MG tablet Take 10 mg by mouth daily as needed.         BP 112/76  Pulse 78  Temp 98.4 F (36.9 C) (Oral)  Resp 16  Ht 6\' 1"  (1.854 m)  Wt 214 lb (97.07 kg)  BMI 28.23 kg/m2  SpO2 97%       Objective:   Physical Exam Physical Exam  Constitutional: He is oriented to person, place, and time. He appears well-developed and well-nourished. No distress.  HENT:  Head: Normocephalic and atraumatic.  Right Ear: Tympanic membrane and ear canal normal.  Left Ear: Tympanic membrane and ear canal normal.  Mouth/Throat: Oropharynx is clear and moist.  Eyes: Pupils are equal, round, and reactive to light. No scleral icterus.  Neck:  Normal range of motion. No thyromegaly present.  Cardiovascular: Normal rate and regular rhythm.   No murmur heard. Pulmonary/Chest: Effort normal and breath sounds normal. No respiratory distress. He has no wheezes. He has no rales. He exhibits no tenderness.  Abdominal: Soft. Bowel sounds are normal. He exhibits no distension and no mass. There is no tenderness. There is no rebound and no guarding.  Musculoskeletal: He exhibits no edema.  Lymphadenopathy:    He has no cervical adenopathy.  Neurological: He is alert and oriented to person, place, and time.  He exhibits normal muscle tone. Coordination normal.  Skin: Skin is warm and dry.  Psychiatric: He has a normal mood and affect. His behavior is normal. Judgment and thought content normal.          Assessment & Plan:           Assessment & Plan:

## 2011-10-06 NOTE — Patient Instructions (Addendum)
Please complete your blood work prior to leaving.  Follow up as needed.   

## 2011-10-07 LAB — URINALYSIS, ROUTINE W REFLEX MICROSCOPIC
Hgb urine dipstick: NEGATIVE
Leukocytes, UA: NEGATIVE
Nitrite: NEGATIVE
Specific Gravity, Urine: 1.029 (ref 1.005–1.030)
pH: 6 (ref 5.0–8.0)

## 2011-10-11 ENCOUNTER — Telehealth: Payer: Self-pay | Admitting: Family

## 2011-10-11 ENCOUNTER — Other Ambulatory Visit: Payer: Self-pay | Admitting: Family

## 2011-10-11 NOTE — Telephone Encounter (Signed)
Reviewed labs with pt.  Bili remains elevated. 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 Asked pt to return to lab for some additional testing and he is agreeable to do so.

## 2011-10-12 LAB — RETICULOCYTES
RBC.: 4.75 MIL/uL (ref 4.22–5.81)
Retic Ct Pct: 0.9 % (ref 0.4–2.3)

## 2011-10-13 LAB — MANUAL DIFFERENTIAL
Eosinophils Manual: 5 % (ref 0–5)
Lymphocytes Manual: 43 % (ref 12–46)
Monocytes Manual: 8 % (ref 3–12)
Myelocytes Manual: 0 %
Neutrophils Manual: 40 % — ABNORMAL LOW (ref 43–77)

## 2011-10-16 ENCOUNTER — Telehealth: Payer: Self-pay | Admitting: Family

## 2011-10-16 NOTE — Telephone Encounter (Signed)
See message.

## 2011-10-27 ENCOUNTER — Ambulatory Visit (INDEPENDENT_AMBULATORY_CARE_PROVIDER_SITE_OTHER): Payer: BC Managed Care – PPO | Admitting: Family

## 2011-10-27 ENCOUNTER — Encounter: Payer: Self-pay | Admitting: Family

## 2011-10-27 VITALS — BP 120/80 | HR 66 | Temp 98.0°F | Resp 16 | Ht 73.0 in | Wt 217.0 lb

## 2011-10-27 DIAGNOSIS — J40 Bronchitis, not specified as acute or chronic: Secondary | ICD-10-CM

## 2011-10-27 MED ORDER — AZITHROMYCIN 250 MG PO TABS: ORAL_TABLET | ORAL | Status: DC

## 2011-10-27 MED ORDER — BENZONATATE 100 MG PO CAPS: 100.0000 mg | ORAL_CAPSULE | Freq: Three times a day (TID) | ORAL | Status: AC | PRN

## 2011-10-27 NOTE — Progress Notes (Signed)
Subjective:    Patient ID: Jon Palmer, male    DOB: [birth year], 31 y.o.   MRN: [id number]  HPI  Jon Palmer is a 31 yr old male who presents today with chief complaint of cough.  Jon Palmer reports that Jon Palmer initially started feeling sick about 10 days ago with sore throat, nasal congestion.  Then started to feel better.  Then this past Wednesday, patient started coughing, and developed nasal congestion.  Jon Palmer denies associated fever.  Tried nyquil/dayquil early in the illness with some improvement in his symptoms. .  Review of Systems See HPI  Past Medical History  Diagnosis Date  . Allergy     allergic rhinitis  . Hernia     as an infant    History   Social History  . Marital Status: Married    Spouse Name: N/A    Number of Children: 0  . 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 Smoking status: Former Games developer  . Smokeless tobacco: Never Used  . Alcohol Use: Yes     1-2 beers some days  . Drug Use: Not on file  . Sexually Active: Not on file   Other Topics Concern  . Not on file   Social History Narrative   regular exercise: yesMale partner    Past Surgical History  Procedure Date  . Tonsillectomy     during college  . Uvulectomy 2006  . Hernia repair 11/2009    bilateral  . Tympanostomy tube placement     as a child  . Wisdom tooth extraction     Family History  Problem Relation Age of Onset  . Hyperlipidemia Mother   . Hypertension Mother   . Hyperlipidemia Father   . Hypertension Father   . Cancer Father     bladder  . Heart disease Maternal Grandmother     multiple heart bypasses  . Heart attack Maternal Grandfather   . Heart disease Paternal Grandmother     multiple heart bypasses    Allergies  Allergen Reactions  . Penicillins     REACTION: hives    Current Outpatient Prescriptions on File Prior to Visit  Medication Sig Dispense Refill  . cetirizine (ZYRTEC) 10 MG tablet  Take 10 mg by mouth daily as needed.       . fluticasone (FLONASE) 50 MCG/ACT nasal spray Place 2 sprays into the nose daily.  16 g  3    BP 120/80  Pulse 66  Temp 98 F (36.7 C) (Oral)  Resp 16  Ht 6\' 1"  (1.854 m)  Wt 217 lb (98.431 kg)  BMI 28.63 kg/m2  SpO2 96%       Objective:   Physical Exam  Constitutional: Jon Palmer is oriented to person, place, and time. Jon Palmer appears well-developed and well-nourished. No distress.  HENT:  Head: Normocephalic and atraumatic.  Right Ear: Tympanic membrane and ear canal normal.  Left Ear: Tympanic membrane and ear canal normal.  Mouth/Throat: No oropharyngeal exudate or posterior oropharyngeal edema.  Cardiovascular: Normal rate and regular rhythm.   No murmur heard. Pulmonary/Chest: Breath sounds normal. No respiratory distress. Jon Palmer has no wheezes. Jon Palmer has no rales. Jon Palmer exhibits no tenderness.  Musculoskeletal: Jon Palmer exhibits no edema.  Neurological: Jon Palmer is alert and oriented to person, place, and time.  Skin: Skin is warm and dry. No rash noted. No erythema. No pallor.  Psychiatric: Jon Palmer has a normal  mood and affect. His behavior is normal. Judgment and thought content normal.          Assessment & Plan:

## 2011-10-27 NOTE — Patient Instructions (Addendum)

## 2011-10-27 NOTE — Assessment & Plan Note (Signed)
Rx with zithromax.  Add tessalon prn cough.

## 2011-10-31 ENCOUNTER — Encounter: Payer: Self-pay | Admitting: Family

## 2011-11-06 ENCOUNTER — Encounter: Payer: Self-pay | Admitting: Family

## 2011-11-06 ENCOUNTER — Ambulatory Visit (INDEPENDENT_AMBULATORY_CARE_PROVIDER_SITE_OTHER): Payer: BC Managed Care – PPO | Admitting: Family

## 2011-11-06 VITALS — BP 128/86 | HR 58 | Temp 97.8°F | Resp 16 | Wt 215.0 lb

## 2011-11-06 DIAGNOSIS — G5620 Lesion of ulnar nerve, unspecified upper limb: Secondary | ICD-10-CM

## 2011-11-06 MED ORDER — CYCLOBENZAPRINE HCL 5 MG PO TABS: 5.0000 mg | ORAL_TABLET | Freq: Every evening | ORAL | Status: DC | PRN

## 2011-11-06 MED ORDER — METHYLPREDNISOLONE (PAK) 4 MG PO TABS: ORAL_TABLET | ORAL | Status: DC

## 2011-11-06 NOTE — Patient Instructions (Addendum)
Call if symptoms worsen or if no improvement in 1 week.  

## 2011-11-06 NOTE — Progress Notes (Signed)
Subjective:    Patient ID: Jon Palmer, male    DOB: 1980-04-20, 31 y.o.   MRN: [id number]  HPI  Jon Palmer is a 31 yr old male who presents today with chief complaint of numbness and tingling in the right hand.  Has been present x 1 week and is worsening.  Initially ring finger and pinky finger were affected.  Then developed some soreness of the wrist and lateral right hand.  He also reports some right upper back soreness and some right neck soreness.  Recently increased his weights at the gym.  Reports that he started Aleve one week ago without improvement.   He reports that he is has been doing reading online and feels "scared" that he is going to lose feeling in his hands. Also reports that his dad has hx of cervical disc disease and has undergone surgery for this and he is afraid that the same will happen to him.   Review of Systems See HPI  Past Medical History  Diagnosis Date  . Allergy     allergic rhinitis  . Hernia     as an infant    History   Social History  . Marital Status: Married    Spouse Name: N/A    Number of Children: 0  . 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 Smoking status: Former Games developer  . Smokeless tobacco: Never Used  . Alcohol Use: Yes     1-2 beers some days  . Drug Use: Not on file  . Sexually Active: Not on file   Other Topics Concern  . Not on file   Social History Narrative   regular exercise: yesMale partner    Past Surgical History  Procedure Date  . Tonsillectomy     during college  . Uvulectomy 2006  . Hernia repair 11/2009    bilateral  . Tympanostomy tube placement     as a child  . Wisdom tooth extraction     Family History  Problem Relation Age of Onset  . Hyperlipidemia Mother   . Hypertension Mother   . Hyperlipidemia Father   . Hypertension Father   . Cancer Father     bladder  . Heart disease Maternal Grandmother     multiple  heart bypasses  . Heart attack Maternal Grandfather   . Heart disease Paternal Grandmother     multiple heart bypasses    Allergies  Allergen Reactions  . Penicillins     REACTION: hives    Current Outpatient Prescriptions on File Prior to Visit  Medication Sig Dispense Refill  . fluticasone (FLONASE) 50 MCG/ACT nasal spray Place 2 sprays into the nose daily.  16 g  3  . azithromycin (ZITHROMAX) 250 MG tablet 2 tabs by mouth today, then one tablet by mouth once daily for 4 more days.  6 tablet  0  . cetirizine (ZYRTEC) 10 MG tablet Take 10 mg by mouth daily as needed.         BP 128/86  Pulse 58  Temp 97.8 F (36.6 C) (Oral)  Resp 16  Wt 215 lb (97.523 kg)  SpO2 99%       Objective:   Physical Exam  Constitutional: He is oriented to person, place, and time. He appears well-developed and well-nourished. No distress.  Musculoskeletal:       + tenderness to palpation of right  neck and overlying right scapula.   Neurological: He is alert and oriented to person, place, and time.       Bilateral hand grasps 5/5 Bilateral upper extremity strength 5/5. R hand/fingers- sensation intact to light touch. Neg Phalans right hand.  Psychiatric:       tearful          Assessment & Plan:

## 2011-11-06 NOTE — Assessment & Plan Note (Signed)
Doubt cervical disc disease at this point.  Suspect musculoskeletal strain from recent weight lifting along with ulnar nerve compression. Recommended that he avoid weight lifting until symptoms improve.  Will give trial of medrol dose pak along with flexeril hs prn.  If no improvement with these measures, consider cervical imaging and referral to orthopedics.

## 2011-11-10 ENCOUNTER — Encounter: Payer: Self-pay | Admitting: Family

## 2011-11-10 ENCOUNTER — Ambulatory Visit (INDEPENDENT_AMBULATORY_CARE_PROVIDER_SITE_OTHER): Payer: BC Managed Care – PPO | Admitting: Family

## 2011-11-10 VITALS — BP 118/84 | HR 72 | Temp 97.7°F | Resp 16 | Wt 215.1 lb

## 2011-11-10 DIAGNOSIS — M542 Cervicalgia: Secondary | ICD-10-CM | POA: Insufficient documentation

## 2011-11-10 NOTE — Assessment & Plan Note (Signed)
Ongoing pain despite use of medrol dose pak. Will obtain MRI of the C-spine.  Recommended aleve PRN.  Pending results of Cspine, consider referral to ortho if negative (for additional evaluation of ulnar nerve/CTS).  If cervical disc disease noted plan referral to neurosurgery.

## 2011-11-10 NOTE — Patient Instructions (Addendum)
You will be contacted about your MRI of your neck.  Please schedule a follow up appointment in 1 month.

## 2011-11-10 NOTE — Progress Notes (Signed)
Subjective:    Patient ID: NIKKI JENSON, male    DOB: Feb 09, 1980, 31 y.o.   MRN: [id number]  HPI  Mr. Swoveland is a 31 yr old male who presents today for follow up.  Last visit he was evaluated with complaint of back pain and bilateral hand numbness. He was prescribed a medrol dose pak which he has been taking without improvement.  Tomorrow will be his last dose of medrol. He reports + pain in the right upper back pain - and notes that when back hurts the right hand tingles.  He is now starting to note similar symptoms, though less severe in the left hand. Denies weakness in the right hand except when it is numb.  Review of Systems See HPI  Past Medical History  Diagnosis Date  . Allergy     allergic rhinitis  . Hernia     as an infant    History   Social History  . Marital Status: Married    Spouse Name: N/A    Number of Children: 0  . 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 Smoking status: Former Games developer  . Smokeless tobacco: Never Used  . Alcohol Use: Yes     1-2 beers some days  . Drug Use: Not on file  . Sexually Active: Not on file   Other Topics Concern  . Not on file   Social History Narrative   regular exercise: yesMale partner    Past Surgical History  Procedure Date  . Tonsillectomy     during college  . Uvulectomy 2006  . Hernia repair 11/2009    bilateral  . Tympanostomy tube placement     as a child  . Wisdom tooth extraction     Family History  Problem Relation Age of Onset  . Hyperlipidemia Mother   . Hypertension Mother   . Hyperlipidemia Father   . Hypertension Father   . Cancer Father     bladder  . Heart disease Maternal Grandmother     multiple heart bypasses  . Heart attack Maternal Grandfather   . Heart disease Paternal Grandmother     multiple heart bypasses    Allergies  Allergen Reactions  . Penicillins     REACTION: hives    Current  Outpatient Prescriptions on File Prior to Visit  Medication Sig Dispense Refill  . cyclobenzaprine (FLEXERIL) 5 MG tablet Take 1 tablet (5 mg total) by mouth at bedtime as needed for muscle spasms.  15 tablet  0  . fluticasone (FLONASE) 50 MCG/ACT nasal spray Place 2 sprays into the nose daily.  16 g  3  . methylPREDNIsolone (MEDROL DOSPACK) 4 MG tablet follow package directions  21 tablet  0    BP 118/84  Pulse 72  Temp 97.7 F (36.5 C) (Oral)  Resp 16  Wt 215 lb 1.3 oz (97.56 kg)       Objective:   Physical Exam  Constitutional: He is oriented to person, place, and time. He appears well-developed and well-nourished. No distress.  Cardiovascular: Normal rate and regular rhythm.   No murmur heard. Pulmonary/Chest: Effort normal and breath sounds normal. No respiratory distress. He has no wheezes. He has no rales. He exhibits no tenderness.  Musculoskeletal:       Non tender but pt describes "inside pain" just slightly right of midline in this area.   Neurological: He  is alert and oriented to person, place, and time.       Right hand grasp is very slightly weaker than left.  Sensation to light touch is intact bilaterally on hands and forearms.   Bilateral UE strength is 5/5.          Assessment & Plan:

## 2011-11-14 ENCOUNTER — Telehealth: Payer: Self-pay | Admitting: Family

## 2011-11-14 ENCOUNTER — Ambulatory Visit (HOSPITAL_BASED_OUTPATIENT_CLINIC_OR_DEPARTMENT_OTHER)
Admission: RE | Admit: 2011-11-14 | Discharge: 2011-11-14 | Disposition: A | Discharge status: Home / Self Care | Payer: BC Managed Care – PPO | Source: Ambulatory Visit | Attending: Family | Admitting: Family

## 2011-11-14 DIAGNOSIS — R209 Unspecified disturbances of skin sensation: Secondary | ICD-10-CM | POA: Insufficient documentation

## 2011-11-14 DIAGNOSIS — M542 Cervicalgia: Secondary | ICD-10-CM | POA: Insufficient documentation

## 2011-11-14 DIAGNOSIS — R200 Anesthesia of skin: Secondary | ICD-10-CM

## 2011-11-14 NOTE — Telephone Encounter (Signed)
Reviewed MRI findings with pt and plan to refer to neurology. Pt is agreeable.

## 2011-12-06 ENCOUNTER — Encounter: Payer: Self-pay | Admitting: Family

## 2011-12-06 ENCOUNTER — Ambulatory Visit (INDEPENDENT_AMBULATORY_CARE_PROVIDER_SITE_OTHER): Payer: BC Managed Care – PPO | Admitting: Family

## 2011-12-06 VITALS — BP 130/78 | HR 86 | Temp 97.8°F | Resp 18 | Wt 211.0 lb

## 2011-12-06 DIAGNOSIS — Z0000 Encounter for general adult medical examination without abnormal findings: Secondary | ICD-10-CM

## 2011-12-06 DIAGNOSIS — G5620 Lesion of ulnar nerve, unspecified upper limb: Secondary | ICD-10-CM

## 2011-12-06 DIAGNOSIS — A630 Anogenital (venereal) warts: Secondary | ICD-10-CM

## 2011-12-06 MED ORDER — IMIQUIMOD 5 % EX CREA: TOPICAL_CREAM | CUTANEOUS | Status: DC

## 2011-12-06 NOTE — Patient Instructions (Addendum)
You will be contact about your referral to dermatology. Please let us know if you have not heard back within 1 week about your referral.  

## 2011-12-07 DIAGNOSIS — A630 Anogenital (venereal) warts: Secondary | ICD-10-CM | POA: Insufficient documentation

## 2011-12-07 NOTE — Assessment & Plan Note (Signed)
Will treat with aldara cream and refer to dermatology.

## 2011-12-07 NOTE — Assessment & Plan Note (Signed)
Unchanged. For EMG studies.  Defer treatment to neuro.

## 2011-12-07 NOTE — Progress Notes (Signed)
Subjective:    Patient ID: Jon Palmer, male    DOB: Nov 21, 1980, 31 y.o.   MRN: [id number]  HPI  Arm Pain- pt is having bilateral arm pain.  He is now seeing neurology and is scheduled for nerve conduction studies.  He tells me that his real reason for visit today is to discuss some lesions around his anus. He brings a picture on his phone of his anus.   He reports that he recently noted several small bumps forming on the skin around his anus.  Notes that that he and his current male partner do not use condoms.  He has had "Lots and lots" of other lifetime partners but has always used condoms with these partners. Lesions are not painful or pruritic.  Review of Systems  See HPI  Past Medical History  Diagnosis Date  . Allergy     allergic rhinitis  . Hernia     as an infant  . 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 Marital Status: Married    Spouse Name: N/A    Number of Children: 0  . 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 Smoking status: Former Games developer  . Smokeless tobacco: Never Used  . Alcohol Use: Yes     1-2 beers some days  . Drug Use: Not on file  . Sexually Active: Not on file   Other Topics Concern  . Not on file   Social History Narrative   regular exercise: yesMale partner    Past Surgical History  Procedure Date  . Tonsillectomy     during college  . Uvulectomy 2006  . Hernia repair 11/2009    bilateral  . Tympanostomy tube placement     as a child  . Wisdom tooth extraction     Family History  Problem Relation Age of Onset  . Hyperlipidemia Mother   . Hypertension Mother   . Hyperlipidemia Father   . Hypertension Father   . Cancer Father     bladder  . Heart disease Maternal Grandmother     multiple heart bypasses  . Heart attack Maternal Grandfather   . Heart disease Paternal  Grandmother     multiple heart bypasses    Allergies  Allergen Reactions  . Penicillins     REACTION: hives    Current Outpatient Prescriptions on File Prior to Visit  Medication Sig Dispense Refill  . fluticasone (FLONASE) 50 MCG/ACT nasal spray Place 2 sprays into the nose daily.  16 g  3  . cyclobenzaprine (FLEXERIL) 5 MG tablet Take 1 tablet (5 mg total) by mouth at bedtime as needed for muscle spasms.  15 tablet  0  . methylPREDNIsolone (MEDROL DOSPACK) 4 MG tablet follow package directions  21 tablet  0    BP 130/78  Pulse 86  Temp 97.8 F (36.6 C) (Oral)  Resp 18  Wt 211 lb (95.709 kg)  SpO2 98%        Objective:   Physical Exam  Constitutional: He appears well-developed and well-nourished. No distress.  Genitourinary:       Picture of anus reviewed today.  Multiple small fleshy raised bumps are noted around anal opening.  Psychiatric: He has a normal mood and affect. His behavior is normal. Judgment and thought content  normal.          Assessment & Plan:  25 minutes spent with pt today. >50% of this time was spent counseling pt on cause/treatment and transmission of genital warts. We discussed importance of condom use.

## 2011-12-08 ENCOUNTER — Encounter: Payer: Self-pay | Admitting: Family

## 2011-12-15 ENCOUNTER — Encounter: Payer: Self-pay | Admitting: Family

## 2011-12-15 MED ORDER — CYCLOBENZAPRINE HCL 5 MG PO TABS: 5.0000 mg | ORAL_TABLET | Freq: Two times a day (BID) | ORAL | Status: DC | PRN

## 2012-02-06 ENCOUNTER — Encounter: Payer: Self-pay | Admitting: Family

## 2012-02-06 DIAGNOSIS — R200 Anesthesia of skin: Secondary | ICD-10-CM

## 2012-02-12 ENCOUNTER — Encounter: Payer: Self-pay | Admitting: Family

## 2012-02-12 ENCOUNTER — Ambulatory Visit (INDEPENDENT_AMBULATORY_CARE_PROVIDER_SITE_OTHER): Payer: BC Managed Care – PPO | Admitting: Family

## 2012-02-12 VITALS — BP 120/72 | HR 59 | Temp 98.4°F | Resp 16 | Wt 215.1 lb

## 2012-02-12 DIAGNOSIS — G5620 Lesion of ulnar nerve, unspecified upper limb: Secondary | ICD-10-CM

## 2012-02-12 DIAGNOSIS — A630 Anogenital (venereal) warts: Secondary | ICD-10-CM

## 2012-02-12 DIAGNOSIS — M549 Dorsalgia, unspecified: Secondary | ICD-10-CM | POA: Insufficient documentation

## 2012-02-12 DIAGNOSIS — M542 Cervicalgia: Secondary | ICD-10-CM

## 2012-02-12 MED ORDER — MELOXICAM 7.5 MG PO TABS: 7.5000 mg | ORAL_TABLET | Freq: Every day | ORAL | Status: DC

## 2012-02-12 NOTE — Assessment & Plan Note (Signed)
Resolved

## 2012-02-12 NOTE — Assessment & Plan Note (Signed)
Likely musculoskeletal.  Trial of nsaids.  Could consider referral for MRI of the thoracic spine if no improvement.

## 2012-02-12 NOTE — Progress Notes (Signed)
Subjective:    Patient ID: Jon Palmer, male    DOB: Mar 11, 1980, 32 y.o.   MRN: [id number]  HPI  Jon Palmer is a 32 year old male who presents today for follow up.  Hand pain/numbness- Numb  left hand 5th finger and right 5th finger and right lateral 4th finger.  Occasional right elbow pain.   Neck pain- had MRI of the C-spine which noted mild bulging discs in the C-spaine. He continues to have neck tightness.  Has tried PT and massage without improvement.  Back pain- overlying the right scapula-  Back pain is made worse by driving, sitting at the desk.  Having trouble sleeping due to the discomfort.  Anal warts- saw Derm- completed aldara- reports that biopsy was "negative" and that warts are now resolved per his follow up visit.  Review of Systems See HPI  Past Medical History  Diagnosis Date  . Allergy     allergic rhinitis  . Hernia     as an infant  . 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 Marital Status: Married    Spouse Name: N/A    Number of Children: 0  . 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 Smoking status: Former Games developer  . Smokeless tobacco: Never Used  . Alcohol Use: Yes     Comment: 1-2 beers some days  . Drug Use: Not on file  . Sexually Active: Not on file   Other Topics Concern  . Not on file   Social History Narrative   regular exercise: yesMale partner    Past Surgical History  Procedure Date  . Tonsillectomy     during college  . Uvulectomy 2006  . Hernia repair 11/2009    bilateral  . Tympanostomy tube placement     as a child  . Wisdom tooth extraction     Family History  Problem Relation Age of Onset  . Hyperlipidemia Mother   . Hypertension Mother   . Hyperlipidemia Father   . Hypertension Father   . Cancer Father     bladder  . Heart disease Maternal Grandmother      multiple heart bypasses  . Heart attack Maternal Grandfather   . Heart disease Paternal Grandmother     multiple heart bypasses    Allergies  Allergen Reactions  . Penicillins     REACTION: hives    Current Outpatient Prescriptions on File Prior to Visit  Medication Sig Dispense Refill  . fluticasone (FLONASE) 50 MCG/ACT nasal spray Place 2 sprays into the nose daily.  16 g  3  . cyclobenzaprine (FLEXERIL) 5 MG tablet Take 1 tablet (5 mg total) by mouth 2 (two) times daily as needed for muscle spasms.  20 tablet  0  . imiquimod (ALDARA) 5 % cream Apply topically to affected area at bedtime 3 times weekly.  Wash off in AM.  12 each  2    BP 120/72  Pulse 59  Temp 98.4 F (36.9 C) (Oral)  Resp 16  Wt 215 lb 1.3 oz (97.56 kg)  SpO2 99%       Objective:   Physical Exam  Constitutional: He is oriented to person, place, and time. He appears well-developed and well-nourished. No distress.  HENT:  Head: Normocephalic and atraumatic.  Mouth/Throat: No oropharyngeal exudate.  Cardiovascular: Normal rate and regular rhythm.   No murmur heard. Pulmonary/Chest: Effort normal and breath sounds normal. No respiratory distress. He has no wheezes. He has no rales. He exhibits no tenderness.  Musculoskeletal: He exhibits no edema.  Lymphadenopathy:    He has no cervical adenopathy.  Neurological: He is alert and oriented to person, place, and time.       Strong equal hand grasps, bilateral UE strength is 5/5.  Skin: Skin is warm and dry.  Psychiatric: He has a normal mood and affect. His behavior is normal. Judgment and thought content normal.          Assessment & Plan:

## 2012-02-12 NOTE — Assessment & Plan Note (Signed)
He continues to be symptomatic.  Reports normal nerve conduction studies with Dr. Craige Cotta.  He desires a second opinion and a referral has been made to [facility].

## 2012-02-12 NOTE — Addendum Note (Signed)
Addended by: Sandford Craze on: 02/12/2012 09:17 AM   Modules accepted: Orders

## 2012-02-12 NOTE — Patient Instructions (Addendum)
You will be contact about your referral to neurology.  Please let us know if you have not heard back within 1 week about your referral. Call if symptoms worsen or if no improvement in 1 month.

## 2012-02-12 NOTE — Assessment & Plan Note (Signed)
Unchanged.  No indication for surgery.  Symptoms are not severe.  Rx with short course of NSAIDS.

## 2012-02-16 ENCOUNTER — Ambulatory Visit (INDEPENDENT_AMBULATORY_CARE_PROVIDER_SITE_OTHER): Payer: BC Managed Care – PPO | Admitting: Neurology

## 2012-02-16 ENCOUNTER — Telehealth: Payer: Self-pay | Admitting: Neurology

## 2012-02-16 ENCOUNTER — Encounter: Payer: Self-pay | Admitting: Neurology

## 2012-02-16 VITALS — BP 108/68 | HR 68 | Temp 98.3°F | Resp 12 | Ht 73.0 in | Wt 216.0 lb

## 2012-02-16 DIAGNOSIS — G5620 Lesion of ulnar nerve, unspecified upper limb: Secondary | ICD-10-CM

## 2012-02-16 DIAGNOSIS — M898X1 Other specified disorders of bone, shoulder: Secondary | ICD-10-CM

## 2012-02-16 DIAGNOSIS — M899 Disorder of bone, unspecified: Secondary | ICD-10-CM

## 2012-02-16 NOTE — Progress Notes (Signed)
Jon Palmer is a 32 YO male with the onset of predominantly right sided paresthesias in the fourth and fifth fingers.  He points out that this started after moving from a cubicle to a desk at work and that he types a lot.  He has associated musculoskeletal type pain on the medial border of the right scapula.  At first the parasthesias would come and go.  No it is more or less present whenever he checks.  He has not actually lost any sensation such as sharp sensation,temmperature or feeling, but he has a pins and needles sensation that is much milder than when, for example, your foot goes to sleep then wakes up, but in some ways similar in quality.  MRI of the C-spine shows mild bulging at C5-6 and C4-5 without obvious neural impingement.  NCV study failed to show any abnormalities of the median or ulnar nerves and RUE EMG was normal.  Massage therapy feels god but hasn't dramatically changed the symptoms.  Trigger point injection was also tried x 1.  He had an elbow brace at one point, possibly too tight in retrospect.  He is able to do all of his activities.  His shoulder blade pain does get worse as the work week wears on, but his tingling does not fluctuate all that much.  He is working out with eliptical and did try several visits w massage therapy.  Past Medical History  Diagnosis Date  . Allergy     allergic rhinitis  . Hernia     as an infant  . 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 fluticasone (FLONASE) 50 MCG/ACT nasal spray Place 2 sprays into the nose daily.  16 g  3  . meloxicam (MOBIC) 7.5 MG tablet Take 1 tablet (7.5 mg total) by mouth daily.  20 tablet  0   P[place]illins History   Social History  . Marital Status: Married    Spouse Name: N/A    Number of Children: 0  . Years of Education: N/A   Occupational History  . [employer]   Social History Main Topics  . Smoking status: Former Smoker    Quit date: 02/07/2008  . Smokeless tobacco: Never Used  . Alcohol Use: Yes     Comment: 1-2 beers some days  . Drug Use: No  . Sexually Active: Not on file   Other Topics Concern  . Not on file   Social History Narrative   regular exercise: yesMale partner    Family History  Problem Relation Age of Onset  . Hyperlipidemia Mother   . Hypertension Mother   . Hyperlipidemia Father   . Hypertension Father   . Cancer Father     bladder  . Heart disease Maternal Grandmother     multiple heart bypasses  . Heart attack Maternal Grandfather   . Heart disease Paternal Grandmother     multiple heart bypasses    BP 108/68  Pulse 68  Temp 98.3 F (36.8 C)  Resp 12  Ht 6\' 1"  (1.854 m)  Wt 216 lb (97.977 kg)  BMI 28.50 kg/m2   Review of systems unremarkable.  Neurologically, no prior nurologic symptoms of note and no neurologic symptoms outside an ulnar nerve territory.  Alert and oriented x 3.  Memory function appears to be intact.  Concentration and attention are normal for educational level and background.  Speech is fluent and without significant word finding difficulty.  Is aware of current events.  The ulnar grooves are amply wide and there is no popping or jumping of the nerve out of the groove with flexion.  Neck ROM is adequate.  Cranial nerve II through XII are within normal limits.  This includes normal optic discs and acuity, EOMI, PERLA, facial movement and sensation intact, hearing grossly intact, gag intact,Uvula raises symmetrically and tongue protrudes evenly. Motor strength is 5 over 5 throughout all limbs.  No atrophy, abnormal tone or tremors. Reflexes are 2+ and symmetric in the upper and lower extremities Sensory exam is intact. Coordination is intact for fine movements and rapid alternating movements in all limbs Gait and station are normal.   Impression:  Probable ulnar nerve  irritation or impingement with paraesthesias in the fourth and fifth fingers mainly on the righ,t wihout loss of sensation or motor function by gross exam or by EMG/NCV.  The symptoms have proved stubborn, but I do agree that this is most likely a peripheral nervous system issue involving some part of the ulnar nerve, possibly at the elbow, but precise localization unclear.  Also agree that the periscapular pain may be musculoskeletal.  Plan:  Try ace wrap loosely around elbow at night to prevent elbow flexion while asleep.  The ulnar groove is more constricted with elbow flexion If symptoms worsen or he shows signs of loss of sensation , muscle strength or atrophy, repeat NCV He can try acupuncture if he would like.  I left him a contact number. As long as symptoms are stable to improving, patience is urged and I can see him again if things take a turn for the worse.  Thank you for referring this pleasant and intelligent young man for evaluation  As an after thought,  It wouldn't hurt to try B-12 sublingual for a month or two, although this would not be a typical history for B-12 deficiency, it wouldn't hurt.

## 2012-02-16 NOTE — Patient Instructions (Addendum)
Please follow up as needed 

## 2012-02-16 NOTE — Telephone Encounter (Signed)
Message copied by Benay Spice on Fri Feb 16, 2012  4:30 PM ------      Message from: [place], [place]      Created: Fri Feb 16, 2012  2:35 PM       As an after thought,  It wouldn't hurt to try B-12 sublingual lozenges for a month, although this would not be a typical history for B-12 deficiency,it can in some cases improve nerve function.  Please contact patient to suggest this.            Thank you

## 2012-02-16 NOTE — Telephone Encounter (Signed)
Spoke with the patient. Information given as per Dr. Smiley Houseman re: B12 sublingual supplement. Recommended he try it for a month to see if there was improvement of his sx. The patient was in agreement.

## 2012-03-04 ENCOUNTER — Encounter: Payer: Self-pay | Admitting: Family

## 2012-03-04 ENCOUNTER — Ambulatory Visit (INDEPENDENT_AMBULATORY_CARE_PROVIDER_SITE_OTHER): Payer: BC Managed Care – PPO | Admitting: Family

## 2012-03-04 VITALS — BP 120/70 | HR 62 | Temp 98.0°F | Resp 16 | Ht 74.0 in | Wt 217.1 lb

## 2012-03-04 DIAGNOSIS — H65499 Other chronic nonsuppurative otitis media, unspecified ear: Secondary | ICD-10-CM

## 2012-03-04 NOTE — Patient Instructions (Addendum)
Start zyrtec-D.  Call if no improvement in 3 days.

## 2012-03-04 NOTE — Assessment & Plan Note (Signed)
No clear sign of infection at this time.  Recommended zyrtec D, continue flonase, call if symptoms worsen or if no improvement in 3 days. Can consider abx at that time.

## 2012-03-04 NOTE — Progress Notes (Signed)
Subjective:    Patient ID: Jon Palmer, male    DOB: [birth year], 32 y.o.   MRN: [id number]  HPI  Jon Palmer is a 32 yr old male who presents today with chief complaint of left ear pain.  Pain/pressure has been present x 3-4 weeks. Discomfort is constant.  Denies associated fever.  Reports chronic nasal drainage which is clear. He continues flonase.  Review of Systems See HPI  Past Medical History  Diagnosis Date  . Allergy     allergic rhinitis  . Hernia     as an infant  . 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 Marital Status: Married    Spouse Name: N/A    Number of Children: 0  . 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 Smoking status: Former Smoker    Quit date: 02/07/2008  . Smokeless tobacco: Never Used  . Alcohol Use: Yes     Comment: 1-2 beers some days  . Drug Use: No  . Sexually Active: Not on file   Other Topics Concern  . Not on file   Social History Narrative   regular exercise: yesMale partner    Past Surgical History  Procedure Date  . Tonsillectomy     during college  . Uvulectomy 2006  . Hernia repair 11/2009    bilateral  . Tympanostomy tube placement     as a child  . Wisdom tooth extraction     Family History  Problem Relation Age of Onset  . Hyperlipidemia Mother   . Hypertension Mother   . Hyperlipidemia Father   . Hypertension Father   . Cancer Father     bladder  . Heart disease Maternal Grandmother     multiple heart bypasses  . Heart attack Maternal Grandfather   . Heart disease Paternal Grandmother     multiple heart bypasses    Allergies  Allergen Reactions  . Penicillins     REACTION: hives    Current Outpatient Prescriptions on File Prior to Visit  Medication Sig Dispense Refill  . fluticasone (FLONASE) 50 MCG/ACT nasal spray Place 2 sprays into the  nose daily.  16 g  3  . meloxicam (MOBIC) 7.5 MG tablet Take 1 tablet (7.5 mg total) by mouth daily.  20 tablet  0    BP 120/70  Pulse 62  Temp 98 F (36.7 C) (Oral)  Resp 16  Ht 6\' 2"  (1.88 m)  Wt 217 lb 1.3 oz (98.467 kg)  BMI 27.87 kg/m2  SpO2 99%       Objective:   Physical Exam  Constitutional: He is oriented to person, place, and time. He appears well-developed and well-nourished. No distress.  HENT:  Head: Normocephalic and atraumatic.  Right Ear: Tympanic membrane and ear canal normal.       Clear fluid noted behind L TM without erythema or bulging.   Cardiovascular: Normal rate and regular rhythm.   No murmur heard. Pulmonary/Chest: Effort normal and breath sounds normal. No respiratory distress. He has no wheezes. He has no rales. He exhibits no tenderness.  Musculoskeletal: He exhibits no edema.  Neurological: He is alert and oriented to person, place, and time.  Skin: Skin is warm and dry.  Psychiatric: He has a normal mood and affect. His behavior  is normal. Thought content normal.          Assessment & Plan:

## 2012-03-06 ENCOUNTER — Encounter: Payer: Self-pay | Admitting: Family

## 2012-03-13 ENCOUNTER — Encounter: Payer: Self-pay | Admitting: Family

## 2012-03-13 MED ORDER — AZITHROMYCIN 250 MG PO TABS: ORAL_TABLET | ORAL | Status: DC

## 2012-03-18 ENCOUNTER — Encounter: Payer: Self-pay | Admitting: Family

## 2012-03-19 ENCOUNTER — Encounter: Payer: Self-pay | Admitting: Family

## 2012-03-19 DIAGNOSIS — H9209 Otalgia, unspecified ear: Secondary | ICD-10-CM

## 2012-04-01 ENCOUNTER — Ambulatory Visit: Payer: Self-pay | Admitting: Family

## 2012-09-18 ENCOUNTER — Encounter: Payer: Self-pay | Admitting: Family

## 2012-09-18 ENCOUNTER — Ambulatory Visit (INDEPENDENT_AMBULATORY_CARE_PROVIDER_SITE_OTHER): Payer: BC Managed Care – PPO | Admitting: Family

## 2012-09-18 ENCOUNTER — Ambulatory Visit: Payer: Self-pay | Admitting: Family

## 2012-09-18 VITALS — BP 130/96 | HR 71 | Temp 98.6°F | Resp 16 | Wt 216.0 lb

## 2012-09-18 DIAGNOSIS — R109 Unspecified abdominal pain: Secondary | ICD-10-CM

## 2012-09-18 DIAGNOSIS — N419 Inflammatory disease of prostate, unspecified: Secondary | ICD-10-CM | POA: Insufficient documentation

## 2012-09-18 MED ORDER — CIPROFLOXACIN HCL 500 MG PO TABS: 500.0000 mg | ORAL_TABLET | Freq: Two times a day (BID) | ORAL | Status: DC

## 2012-09-18 NOTE — Assessment & Plan Note (Signed)
Will obtain urine culture.  His malaise, lower abdominal/pelvic pain may be due to prostatitis. Will plan empiric rx with cipro. Cipro will also provide coverage for ? Mild L OM.   Plan follow up in 1 week.

## 2012-09-18 NOTE — Addendum Note (Signed)
Addended by: Mervin Kung A on: 09/18/2012 02:52 PM   Modules accepted: Orders

## 2012-09-18 NOTE — Progress Notes (Signed)
Subjective:    Patient ID: Jon Palmer, male    DOB: Jun 29, 1980, 32 y.o.   MRN: [id number]  HPI  Jon Palmer is a 32 yr old male who presents today with chief complaint of sore throat.  He reports that his male partner last week had a white urethral discharge.  They both woke up with sore throat last Wednesday. They were swabbed for strep at urgent care which was reportedly negative.  He reports that partner tested neg for GC/Chlamydia but they were both empirically treated.   Reports sore throat is gone, but still having drainage.  Some groin and abdominal pain x  1 week.  Reports that this pain is different from the pain he had previously prior to his hernia repair.    Review of Systems    see HPI  Past Medical History  Diagnosis Date  . Allergy     allergic rhinitis  . Hernia     as an infant  . 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 Marital Status: Married    Spouse Name: N/A    Number of Children: 0  . 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 Smoking status: Former Smoker    Quit date: 02/07/2008  . Smokeless tobacco: Never Used  . Alcohol Use: Yes     Comment: 1-2 beers some days  . Drug Use: No  . Sexual Activity: Not on file   Other Topics Concern  . Not on file   Social History Narrative   regular exercise: yes   Male partner          Past Surgical History  Procedure Laterality Date  . Tonsillectomy      during college  . Uvulectomy  2006  . Hernia repair  11/2009    bilateral  . Tympanostomy tube placement      as a child  . Wisdom tooth extraction      Family History  Problem Relation Age of Onset  . Hyperlipidemia Mother   . Hypertension Mother   . Hyperlipidemia Father   . Hypertension Father   . Cancer Father     bladder  . Heart disease Maternal Grandmother     multiple heart  bypasses  . Heart attack Maternal Grandfather   . Heart disease Paternal Grandmother     multiple heart bypasses    Allergies  Allergen Reactions  . Penicillins     REACTION: hives    No current outpatient prescriptions on file prior to visit.   No current facility-administered medications on file prior to visit.    BP 130/96  Pulse 71  Temp(Src) 98.6 F (37 C) (Oral)  Resp 16  Wt 216 lb (97.977 kg)  BMI 27.72 kg/m2  SpO2 99%    Objective:   Physical Exam  Constitutional: He is oriented to person, place, and time. He appears well-developed and well-nourished.  HENT:  Head: Normocephalic.  Right Ear: Tympanic membrane and ear canal normal.  Mouth/Throat: No oropharyngeal exudate, posterior oropharyngeal edema, posterior oropharyngeal erythema or tonsillar abscesses.  Mild erythema of the left TM, no bulging  Abdominal: Soft. Bowel sounds are normal. He exhibits no distension.  Mild suprapubic tenderness, mild rlq tenderness without guarding.   Neurological: He is alert and oriented to person,  place, and time.  Psychiatric: He has a normal mood and affect. His behavior is normal. Judgment and thought content normal.          Assessment & Plan:

## 2012-09-18 NOTE — Patient Instructions (Addendum)
Start cipro. Call if worsening abdominal pain- go to ER if severe abdominal pain.  Follow up in 1 week.

## 2012-09-19 ENCOUNTER — Encounter: Payer: Self-pay | Admitting: Family

## 2012-09-19 LAB — URINE CULTURE
Colony Count: NO GROWTH
Organism ID, Bacteria: NO GROWTH

## 2012-09-25 ENCOUNTER — Ambulatory Visit: Payer: BC Managed Care – PPO | Admitting: Family

## 2012-09-27 ENCOUNTER — Encounter: Payer: Self-pay | Admitting: Family

## 2012-09-27 ENCOUNTER — Ambulatory Visit (INDEPENDENT_AMBULATORY_CARE_PROVIDER_SITE_OTHER): Payer: BC Managed Care – PPO | Admitting: Family

## 2012-09-27 VITALS — BP 122/80 | HR 63 | Temp 98.3°F | Resp 16 | Ht 74.0 in | Wt 217.1 lb

## 2012-09-27 DIAGNOSIS — R109 Unspecified abdominal pain: Secondary | ICD-10-CM

## 2012-09-27 DIAGNOSIS — R1030 Lower abdominal pain, unspecified: Secondary | ICD-10-CM | POA: Insufficient documentation

## 2012-09-27 MED ORDER — MELOXICAM 7.5 MG PO TABS: 7.5000 mg | ORAL_TABLET | Freq: Every day | ORAL | Status: DC

## 2012-09-27 NOTE — Progress Notes (Signed)
Subjective:    Patient ID: Jon Palmer, male    DOB: 1980/02/25, 32 y.o.   MRN: [id number]  HPI  Jon Palmer is a 32 yr old male who presents today with chief complaint of bilateral groin pain.  The groin pain has been present for 3 weeks.  He has hx of bilateral inguinal hernia repair with mesh which was performed in 2011 by Dr. Luisa Hart.  Last visit he underwent a urine culture which was negative.  He was treated empirically for prostatitis without any improvement in his symptoms. Reports that he recently started lifting again at the gym and helped some friends move last weekend.  The heavy lifting did seem to worsen the pain.  He reports that he is most uncomfortable while sitting.  The patient denies vomiting. Reports that his bowel movements are normal.  Review of Systems See HPI  Past Medical History  Diagnosis Date  . Allergy     allergic rhinitis  . Hernia     as an infant  . 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 Marital Status: Married    Spouse Name: N/A    Number of Children: 0  . 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 Smoking status: Former Smoker    Quit date: 02/07/2008  . Smokeless tobacco: Never Used  . Alcohol Use: Yes     Comment: 1-2 beers some days  . Drug Use: No  . Sexual Activity: Not on file   Other Topics Concern  . Not on file   Social History Narrative   regular exercise: yes   Male partner          Past Surgical History  Procedure Laterality Date  . Tonsillectomy      during college  . Uvulectomy  2006  . Hernia repair  11/2009    bilateral  . Tympanostomy tube placement      as a child  . Wisdom tooth extraction      Family History  Problem Relation Age of Onset  . Hyperlipidemia Mother   . Hypertension Mother   . Hyperlipidemia Father   . Hypertension Father   .  Cancer Father     bladder  . Heart disease Maternal Grandmother     multiple heart bypasses  . Heart attack Maternal Grandfather   . Heart disease Paternal Grandmother     multiple heart bypasses    Allergies  Allergen Reactions  . Penicillins     REACTION: hives    No current outpatient prescriptions on file prior to visit.   No current facility-administered medications on file prior to visit.    BP 122/80  Pulse 63  Temp(Src) 98.3 F (36.8 C) (Oral)  Resp 16  Ht 6\' 2"  (1.88 m)  Wt 217 lb 1.3 oz (98.467 kg)  BMI 27.86 kg/m2  SpO2 99%       Objective:   Physical Exam  Constitutional: He is oriented to person, place, and time. He appears well-developed and well-nourished. No distress.  Cardiovascular: Normal rate and regular rhythm.   No murmur heard. Pulmonary/Chest: Breath sounds normal. No respiratory distress. He has no wheezes. He has no rales. He exhibits no tenderness.  Genitourinary:  No right inguinal hernia noted on exam.  ? Small LIH noted  in canal with cough on exam.   Neurological: He is alert and oriented to person, place, and time.          Assessment & Plan:

## 2012-09-27 NOTE — Patient Instructions (Addendum)
You will be contacted about your referral to Dr. Luisa Hart.   Go to ER if you develop severe abdominal pain/nausea/vomitting. Follow up with our office as needed.

## 2012-09-27 NOTE — Assessment & Plan Note (Signed)
Will rx meloxicam and refer back to Dr. Mignon Pine for re-evalution of possible recurrent inguinal hernia.

## 2012-10-09 ENCOUNTER — Encounter (INDEPENDENT_AMBULATORY_CARE_PROVIDER_SITE_OTHER): Payer: Self-pay | Admitting: Surgery

## 2012-10-09 ENCOUNTER — Ambulatory Visit (INDEPENDENT_AMBULATORY_CARE_PROVIDER_SITE_OTHER): Payer: BC Managed Care – PPO | Admitting: Surgery

## 2012-10-09 VITALS — BP 132/80 | HR 68 | Resp 14 | Ht 73.0 in | Wt 217.2 lb

## 2012-10-09 DIAGNOSIS — Z8719 Personal history of other diseases of the digestive system: Secondary | ICD-10-CM | POA: Insufficient documentation

## 2012-10-09 DIAGNOSIS — R109 Unspecified abdominal pain: Secondary | ICD-10-CM

## 2012-10-09 DIAGNOSIS — R1031 Right lower quadrant pain: Secondary | ICD-10-CM | POA: Insufficient documentation

## 2012-10-09 DIAGNOSIS — Z9889 Other specified postprocedural states: Secondary | ICD-10-CM

## 2012-10-09 NOTE — Patient Instructions (Signed)
No lifting more than 10 lbs.  Ok to do low resistance activities. CT scan to look at pain.

## 2012-10-09 NOTE — Progress Notes (Signed)
Patient ID: Jon Palmer, male   DOB: 01/03/1981, 32 y.o.   MRN: [id number]  Chief Complaint  Patient presents with  . New Evaluation    epnp/ eval hernia    HPI Jon Palmer is a 32 y.o. male.  Patient returns the chief complaint of bilateral groin pain. He had a laparoscopic bilateral inguinal hernia in 2011. He has had no problems until a month ago while lifting some furniture he developed bilateral groin pain. This is made worse with lifting. He was placed on anti-inflammatory agents. He continues to have intermittent groin pain especially with exertion. He has a history of prostatitis treated with antibiotics in the past. He was placed on antibiotics this time and the pain did not resolve. There is no noticeable bad either groin. The pain is better with rest. HPI  Past Medical History  Diagnosis Date  . Allergy     allergic rhinitis  . Hernia     as an infant  . 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 Tonsillectomy      during college  . Uvulectomy  2006  . Hernia repair  11/2009    bilateral  . Tympanostomy tube placement      as a child  . Wisdom tooth extraction      Family History  Problem Relation Age of Onset  . Hyperlipidemia Mother   . Hypertension Mother   . Hyperlipidemia Father   . Hypertension Father   . Cancer Father     bladder  . Heart disease Maternal Grandmother     multiple heart bypasses  . Heart attack Maternal Grandfather   . Heart disease Paternal Grandmother     multiple heart bypasses    Social History History  Substance Use Topics  . Smoking status: Former Smoker    Quit date: 02/07/2008  . Smokeless tobacco: Never Used  . Alcohol Use: Yes     Comment: 1-2 beers some days    Allergies  Allergen Reactions  . Penicillins     REACTION: hives    Current Outpatient Prescriptions  Medication Sig Dispense Refill  .  meloxicam (MOBIC) 7.5 MG tablet Take 1 tablet (7.5 mg total) by mouth daily.  15 tablet  0   No current facility-administered medications for this visit.    Review of Systems Review of Systems  Constitutional: Negative.   HENT: Negative.   Eyes: Negative.   Respiratory: Negative.   Cardiovascular: Negative.   Gastrointestinal: Negative.   Genitourinary: Positive for dysuria.  Allergic/Immunologic: Negative.   Neurological: Negative.   Hematological: Negative.   Psychiatric/Behavioral: Negative.     Blood pressure 132/80, pulse 68, resp. rate 14, height 6\' 1"  (1.854 m), weight 217 lb 3.2 oz (98.521 kg).  Physical Exam Physical Exam  Constitutional: He appears well-developed and well-nourished.  HENT:  Head: Normocephalic and atraumatic.  Eyes: EOM are normal. Pupils are equal, round, and reactive to light.  Abdominal: Hernia confirmed negative in the right inguinal area and confirmed negative in the left inguinal area.    Skin: Skin is warm and dry.    Data Reviewed none  Assessment    Bilateral groin pain 3 years after laparoscopic bilateral inguinal hernia appear with no evidence of recurrence on physical exam    Plan    Sent for CT scan to further evaluate repair sites. Refrain from heavy  lifting. Further care after CT scan done. Nonsteroidal anti-inflammatory agents as needed       Jammie Troup A. 10/09/2012, 1:35 PM

## 2012-10-10 ENCOUNTER — Telehealth (INDEPENDENT_AMBULATORY_CARE_PROVIDER_SITE_OTHER): Payer: Self-pay | Admitting: General Surgery

## 2012-10-10 NOTE — Telephone Encounter (Signed)
Spoke with pt and informed him that he is scheduled for his CT scan at [facility] located at [address] on 10/15/12 at 2:15.  Explained that he cannot have any solid foods 4 hrs before the test and that he will drink his first bottle of contrast at 12:30 and the second bottle at 1:30.

## 2012-10-15 ENCOUNTER — Ambulatory Visit
Admission: RE | Admit: 2012-10-15 | Discharge: 2012-10-15 | Disposition: A | Discharge status: Home / Self Care | Payer: BC Managed Care – PPO | Source: Ambulatory Visit | Attending: Surgery | Admitting: Surgery

## 2012-10-15 DIAGNOSIS — Z8719 Personal history of other diseases of the digestive system: Secondary | ICD-10-CM

## 2012-10-15 DIAGNOSIS — R1031 Right lower quadrant pain: Secondary | ICD-10-CM

## 2012-10-15 MED ORDER — IOHEXOL 300 MG/ML  SOLN
125.0000 mL | Freq: Once | INTRAMUSCULAR | Status: AC | PRN
  Administered 2012-10-15: 125 mL via INTRAVENOUS

## 2012-10-17 ENCOUNTER — Encounter (INDEPENDENT_AMBULATORY_CARE_PROVIDER_SITE_OTHER): Payer: Self-pay | Admitting: Surgery

## 2012-10-17 ENCOUNTER — Telehealth (INDEPENDENT_AMBULATORY_CARE_PROVIDER_SITE_OTHER): Payer: Self-pay

## 2012-10-17 NOTE — Telephone Encounter (Signed)
Pt called in asking about his ct.  He had not read his my chart message yet that Dr Luisa Hart sent him.  I read the message to him.  He understands and is appreciative.

## 2012-12-12 ENCOUNTER — Other Ambulatory Visit: Payer: Self-pay

## 2013-03-21 ENCOUNTER — Encounter: Payer: Self-pay | Admitting: Family

## 2013-03-21 ENCOUNTER — Ambulatory Visit (INDEPENDENT_AMBULATORY_CARE_PROVIDER_SITE_OTHER): Payer: 59 | Admitting: Family

## 2013-03-21 VITALS — BP 116/86 | HR 49 | Temp 97.6°F | Resp 16 | Ht 74.0 in | Wt 197.1 lb

## 2013-03-21 DIAGNOSIS — Z0000 Encounter for general adult medical examination without abnormal findings: Secondary | ICD-10-CM

## 2013-03-21 LAB — CBC WITH DIFFERENTIAL/PLATELET
Basophils Absolute: 0 10*3/uL (ref 0.0–0.1)
Basophils Relative: 1 % (ref 0–1)
EOS PCT: 2 % (ref 0–5)
Eosinophils Absolute: 0.1 10*3/uL (ref 0.0–0.7)
HEMATOCRIT: 42.9 % (ref 39.0–52.0)
HEMOGLOBIN: 14.6 g/dL (ref 13.0–17.0)
LYMPHS ABS: 1.4 10*3/uL (ref 0.7–4.0)
LYMPHS PCT: 42 % (ref 12–46)
MCH: 29.7 pg (ref 26.0–34.0)
MCHC: 34 g/dL (ref 30.0–36.0)
MCV: 87.2 fL (ref 78.0–100.0)
MONO ABS: 0.3 10*3/uL (ref 0.1–1.0)
Monocytes Relative: 9 % (ref 3–12)
Neutro Abs: 1.5 10*3/uL — ABNORMAL LOW (ref 1.7–7.7)
Neutrophils Relative %: 46 % (ref 43–77)
Platelets: 219 10*3/uL (ref 150–400)
RBC: 4.92 MIL/uL (ref 4.22–5.81)
RDW: 14 % (ref 11.5–15.5)
WBC: 3.3 10*3/uL — AB (ref 4.0–10.5)

## 2013-03-21 LAB — BASIC METABOLIC PANEL WITH GFR
BUN: 23 mg/dL (ref 6–23)
CHLORIDE: 105 meq/L (ref 96–112)
CO2: 26 meq/L (ref 19–32)
Calcium: 9.7 mg/dL (ref 8.4–10.5)
Creat: 0.97 mg/dL (ref 0.50–1.35)
GFR, Est African American: >89 mL/min
GFR, Est Non African American: >89 mL/min
Glucose, Bld: 80 mg/dL (ref 70–99)
POTASSIUM: 4.2 meq/L (ref 3.5–5.3)
SODIUM: 140 meq/L (ref 135–145)

## 2013-03-21 LAB — HEPATIC FUNCTION PANEL
ALBUMIN: 4.5 g/dL (ref 3.5–5.2)
ALK PHOS: 51 U/L (ref 39–117)
ALT: 23 U/L (ref 0–53)
AST: 25 U/L (ref 0–37)
Bilirubin, Direct: 0.3 mg/dL (ref 0.0–0.3)
Indirect Bilirubin: 1.1 mg/dL (ref 0.2–1.2)
TOTAL PROTEIN: 7.7 g/dL (ref 6.0–8.3)
Total Bilirubin: 1.4 mg/dL — ABNORMAL HIGH (ref 0.2–1.2)

## 2013-03-21 LAB — LIPID PANEL
CHOL/HDL RATIO: 2.6 ratio
Cholesterol: 143 mg/dL (ref 0–200)
HDL: 56 mg/dL (ref >39)
LDL CALC: 73 mg/dL (ref 0–99)
TRIGLYCERIDES: 68 mg/dL (ref <150)
VLDL: 14 mg/dL (ref 0–40)

## 2013-03-21 LAB — HIV ANTIBODY (ROUTINE TESTING W REFLEX): HIV: NONREACTIVE

## 2013-03-21 LAB — TSH: TSH: 1.579 u[IU]/mL (ref 0.350–4.500)

## 2013-03-21 NOTE — Patient Instructions (Signed)
Please complete lab work prior to leaving. Follow up in 1 year, sooner if problems/concerns.  

## 2013-03-21 NOTE — Progress Notes (Signed)
Subjective:    Patient ID: Jon Palmer, male    DOB: 12/26/1980, 33 y.o.   MRN: [id number]  HPI  Patient presents today for complete physical.  Immunizations: Tetanus up to date- had flu shot at work.  Diet: has lost 20 pounds since last visit.  Body mass index is 25.29 kg/(m^2). Exercise: treadmill 30 minutes, 30 minutes elliptical.  Counts calories on myfitnesspal   Wt Readings from Last 3 Encounters:  03/21/13 197 lb 1.3 oz (89.395 kg)  10/09/12 217 lb 3.2 oz (98.521 kg)  09/27/12 217 lb 1.3 oz (98.467 kg)     Review of Systems  Constitutional: Negative for unexpected weight change.  HENT: Negative for hearing loss and rhinorrhea.   Eyes: Negative for visual disturbance.  Respiratory: Negative for cough and shortness of breath.   Cardiovascular: Negative for chest pain.  Gastrointestinal: Negative for vomiting, diarrhea, constipation and blood in stool.  Genitourinary: Negative for dysuria and frequency.  Musculoskeletal: Negative for arthralgias and myalgias.  Skin: Negative for rash.  Neurological: Negative for headaches.  Hematological: Negative for adenopathy.  Psychiatric/Behavioral:       Denies depression/anxiety    Past Medical History  Diagnosis Date  . Allergy     allergic rhinitis  . Hernia     as an infant  . 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 Marital Status: Married    Spouse Name: N/A    Number of Children: 0  . 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 Smoking status: Former Smoker    Quit date: 02/07/2008  . Smokeless tobacco: Never Used  . Alcohol Use: Yes     Comment: 1-2 beers some days  . Drug Use: No  . Sexual Activity: Not on file   Other Topics Concern  . Not on file   Social History Narrative   regular exercise: yes   Male partner          Past Surgical  History  Procedure Laterality Date  . Tonsillectomy      during college  . Uvulectomy  2006  . Hernia repair  11/2009    bilateral  . Tympanostomy tube placement      as a child  . Wisdom tooth extraction      Family History  Problem Relation Age of Onset  . Hyperlipidemia Mother   . Hypertension Mother   . Hyperlipidemia Father   . Hypertension Father   . Cancer Father     bladder  . Heart disease Maternal Grandmother     multiple heart bypasses  . Heart attack Maternal Grandfather   . Heart disease Paternal Grandmother     multiple heart bypasses  . Dementia Paternal Grandfather     Allergies  Allergen Reactions  . Penicillins     REACTION: hives    No current outpatient prescriptions on file prior to visit.   No current facility-administered medications on file prior to visit.    BP 116/86  Pulse 49  Temp(Src) 97.6 F (36.4 C) (Oral)  Resp 16  Ht 6\' 2"  (1.88 m)  Wt 197 lb 1.3 oz (89.395 kg)  BMI 25.29 kg/m2  SpO2 97%       Objective:   Physical Exam  Physical Exam  Constitutional: He is oriented  to person, place, and time. He appears well-developed and well-nourished. No distress.  HENT:  Head: Normocephalic and atraumatic.  Right Ear: Tympanic membrane and ear canal normal.  Left Ear: Tympanic membrane and ear canal normal.  Mouth/Throat: Oropharynx is clear and moist.  Eyes: Pupils are equal, round, and reactive to light. No scleral icterus.  Neck: Normal range of motion. No thyromegaly present.  Cardiovascular: Normal rate and regular rhythm.   No murmur heard. Pulmonary/Chest: Effort normal and breath sounds normal. No respiratory distress. He has no wheezes. He has no rales. He exhibits no tenderness.  Abdominal: Soft. Bowel sounds are normal. He exhibits no distension and no mass. There is no tenderness. There is no rebound and no guarding.  Musculoskeletal: He exhibits no edema.  Lymphadenopathy:    He has no cervical adenopathy.    Neurological: He is alert and oriented to person, place, and time. He has normal patellar reflexes. He exhibits normal muscle tone. Coordination normal.  Skin: Skin is warm and dry.  Psychiatric: He has a normal mood and affect. His behavior is normal. Judgment and thought content normal.          Assessment & Plan:         Assessment & Plan:

## 2013-03-21 NOTE — Assessment & Plan Note (Signed)
Applauded pt on his weight loss. Continue healthy diet. Obtain fasting lab work.  Immunizations up to date.

## 2013-03-21 NOTE — Progress Notes (Signed)
Pre visit review using our clinic review tool, if applicable. No additional management support is needed unless otherwise documented below in the visit note. 

## 2013-03-22 LAB — URINALYSIS, ROUTINE W REFLEX MICROSCOPIC
Bilirubin Urine: NEGATIVE
Glucose, UA: NEGATIVE mg/dL
Hgb urine dipstick: NEGATIVE
KETONES UR: 15 mg/dL — AB
Leukocytes, UA: NEGATIVE
NITRITE: NEGATIVE
PROTEIN: NEGATIVE mg/dL
SPECIFIC GRAVITY, URINE: 1.027 (ref 1.005–1.030)
UROBILINOGEN UA: 0.2 mg/dL (ref 0.0–1.0)
pH: 5.5 (ref 5.0–8.0)

## 2013-03-22 LAB — URINALYSIS, MICROSCOPIC ONLY
Bacteria, UA: NONE SEEN
Casts: NONE SEEN
Crystals: NONE SEEN
Squamous Epithelial / LPF: NONE SEEN

## 2013-05-26 ENCOUNTER — Ambulatory Visit (INDEPENDENT_AMBULATORY_CARE_PROVIDER_SITE_OTHER): Payer: 59 | Admitting: Family

## 2013-05-26 ENCOUNTER — Encounter: Payer: Self-pay | Admitting: Family

## 2013-05-26 VITALS — BP 130/82 | HR 72 | Temp 98.4°F | Resp 16 | Ht 74.0 in | Wt 182.0 lb

## 2013-05-26 DIAGNOSIS — J029 Acute pharyngitis, unspecified: Secondary | ICD-10-CM

## 2013-05-26 LAB — POCT RAPID STREP A (OFFICE): Rapid Strep A Screen: NEGATIVE

## 2013-05-26 MED ORDER — AZITHROMYCIN 250 MG PO TABS
2000.0000 mg | ORAL_TABLET | Freq: Every day | ORAL | Status: AC
  Administered 2013-05-26: 2000 mg via ORAL

## 2013-05-26 NOTE — Patient Instructions (Signed)
Call if symptoms worsen or if not improved in 3 days.

## 2013-05-26 NOTE — Assessment & Plan Note (Signed)
Pt's rapid strep was negative. I did go ahead and treat him empirically for gc/Chlamydia given partner's symptom today. He is pen allergic so I gave azithromycin 2gm PO.

## 2013-05-26 NOTE — Progress Notes (Signed)
Subjective:    Patient ID: Jon GaudierMatthew G Palmer, male    DOB: Jun 04, 1980, 33 y.o.   MRN: [id number]  HPI  [name] is a 33 yr old homosexual male who presents today with chief complaint of sore throat.  Sore throat started yesterday.   His partner also has sore throat and penile discharge.  Partner tested + for strep.    Wt Readings from Last 3 Encounters:  05/26/13 182 lb 0.6 oz (82.573 kg)  03/21/13 197 lb 1.3 oz (89.395 kg)  10/09/12 217 lb 3.2 oz (98.521 kg)   He has been working hard on diet, exercise and weight loss.   Review of Systems See HPI  Past Medical History  Diagnosis Date  . Allergy     allergic rhinitis  . Hernia     as an infant  . Sullivan LoneGilbert syndrome 03/30/2010    Qualifier: Diagnosis of  By: Peggyann Juba'Sullivan FNP, Katrinka BlazingMelissa S     History   Social History  . Marital Status: Married    Spouse Name: N/A    Number of Children: 0  . 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 Smoking status: Former Smoker    Quit date: 02/07/2008  . Smokeless tobacco: Never Used  . Alcohol Use: Yes     Comment: 1-2 beers some days  . Drug Use: No  . Sexual Activity: Not on file   Other Topics Concern  . Not on file   Social History Narrative   regular exercise: yes   Male partner          Past Surgical History  Procedure Laterality Date  . Tonsillectomy      during college  . Uvulectomy  2006  . Hernia repair  11/2009    bilateral  . Tympanostomy tube placement      as a child  . Wisdom tooth extraction      Family History  Problem Relation Age of Onset  . Hyperlipidemia Mother   . Hypertension Mother   . Hyperlipidemia Father   . Hypertension Father   . Cancer Father     bladder  . Heart disease Maternal Grandmother     multiple heart bypasses  . Heart attack Maternal Grandfather   . Heart disease Paternal Grandmother     multiple heart bypasses  . Dementia Paternal Grandfather      Allergies  Allergen Reactions  . Penicillins     REACTION: hives    No current outpatient prescriptions on file prior to visit.   No current facility-administered medications on file prior to visit.    BP 130/82  Pulse 72  Temp(Src) 98.4 F (36.9 C) (Oral)  Resp 16  Ht 6\' 2"  (1.88 m)  Wt 182 lb 0.6 oz (82.573 kg)  BMI 23.36 kg/m2  SpO2 99%       Objective:   Physical Exam  Constitutional: He is oriented to person, place, and time. He appears well-developed and well-nourished. No distress.  HENT:  Head: Normocephalic and atraumatic.  Cardiovascular: Normal rate and regular rhythm.   No murmur heard. Pulmonary/Chest: Effort normal and breath sounds normal. No respiratory distress. He has no wheezes. He has no rales. He exhibits no tenderness.  Musculoskeletal: He exhibits no edema.  Neurological: He is alert and oriented to person, place, and time.  Psychiatric: He has a normal mood and affect. His behavior is  normal. Judgment and thought content normal.          Assessment & Plan:

## 2013-05-26 NOTE — Progress Notes (Signed)
Pre visit review using our clinic review tool, if applicable. No additional management support is needed unless otherwise documented below in the visit note. 

## 2013-08-26 ENCOUNTER — Ambulatory Visit (INDEPENDENT_AMBULATORY_CARE_PROVIDER_SITE_OTHER): Payer: 59 | Admitting: Family Medicine

## 2013-08-26 ENCOUNTER — Encounter: Payer: Self-pay | Admitting: Family Medicine

## 2013-08-26 VITALS — BP 115/75 | HR 61 | Temp 98.6°F | Resp 18 | Ht 74.0 in | Wt 182.0 lb

## 2013-08-26 DIAGNOSIS — I889 Nonspecific lymphadenitis, unspecified: Secondary | ICD-10-CM

## 2013-08-26 DIAGNOSIS — J029 Acute pharyngitis, unspecified: Secondary | ICD-10-CM

## 2013-08-26 LAB — POCT RAPID STREP A (OFFICE): RAPID STREP A SCREEN: NEGATIVE

## 2013-08-26 MED ORDER — AZITHROMYCIN 250 MG PO TABS: ORAL_TABLET | ORAL | Status: DC

## 2013-08-26 NOTE — Progress Notes (Signed)
Pre visit review using our clinic review tool, if applicable. No additional management support is needed unless otherwise documented below in the visit note. 

## 2013-08-26 NOTE — Progress Notes (Signed)
OFFICE NOTE  08/26/2013  CC:  Chief Complaint  Patient presents with  . Sore Throat    x 1 week  . Otalgia    Left ear x 1 week    HPI: Patient is a 33 y.o. Caucasian male who is here for ST and ear pain. Onset over a week ago, mild coughing in mornings.  Sx's on left side of throat and left ear seem to have worsened. Tm 99+ at onset.  No more coughing.  No more sniffles/runny nose.  No rash. He has taken no meds at all for these sx's.  Pertinent PMH:  Past medical, surgical, social hx reviewed.  MEDS:  NONE  PE: Blood pressure 115/75, pulse 61, temperature 98.6 F (37 C), temperature source Temporal, resp. rate 18, height 6\' 2"  (1.88 m), weight 182 lb (82.555 kg), SpO2 98.00%. VS: noted--normal. Gen: alert, NAD, WELL-APPEARING. HEENT: eyes without injection, drainage, or swelling.  Ears: EACs clear, TMs with normal light reflex and landmarks.  Nose: Clear rhinorrhea, with some dried, crusty exudate adherent to mildly injected mucosa.  No purulent d/c.  No paranasal sinus TTP.  No facial swelling.  Throat and mouth without focal lesion.  No pharyngial swelling, erythema, or exudate.  Cobblestoning of post pharynx with some mild erythema here noted.  NO ASYMMETRY.  No tonsillar tissue. Neck: supple.  He has bilat mildly enlarged jugulodigastric lymph nodes, with the left side being tender to touch but no overlying erythema or other skin changes.   SKIN: no rash  LAB: rapid strep neg  IMPRESSION AND PLAN:  Lymphadenitis, with referred pain to left ear. Penicillin allergy.  Will treat with Azithromycin x 5d. Recommended 600 mg ibuprofen tid with food. Sent group A strep clx today.  An After Visit Summary was printed and given to the patient.  FOLLOW UP: prn

## 2013-08-27 ENCOUNTER — Ambulatory Visit: Payer: 59 | Admitting: Medical

## 2013-08-28 LAB — CULTURE, GROUP A STREP: Organism ID, Bacteria: NORMAL

## 2014-04-02 IMAGING — CT CT ABD-PELV W/ CM
2 of 4 series · 9 of 36 positions shown, 15 images · IV contrast (READICAT/WATER & [ID] OMNI 300)
Comparison: CT abdomen pelvis of 05/17/2011

CLINICAL DATA: Bilateral groin pain

CT ABDOMEN AND PELVIS WITH CONTRAST
TECHNIQUE: Multidetector CT imaging of the abdomen and pelvis was
performed following the standard protocol during bolus
administration of intravenous contrast.
Contrast: 125mL OMNIPAQUE IOHEXOL 300 MG/ML  SOLN

[Series 3: abd/pelvis with · axial · 0.74mm/px · z∈[-361,-11]mm · 8 of 85 slices shown, 13 images]
[im 10/85  soft-tissue]
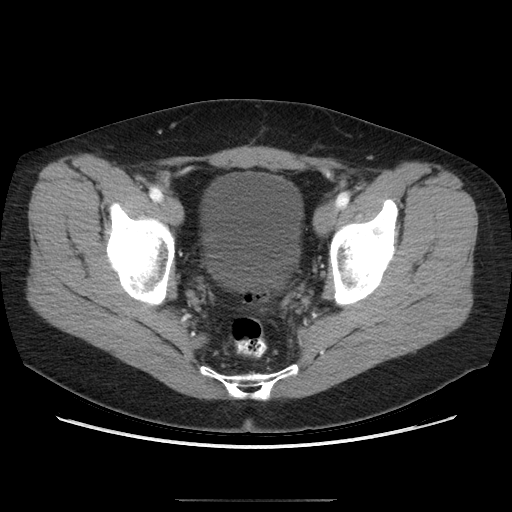
[im 10/85  bone]
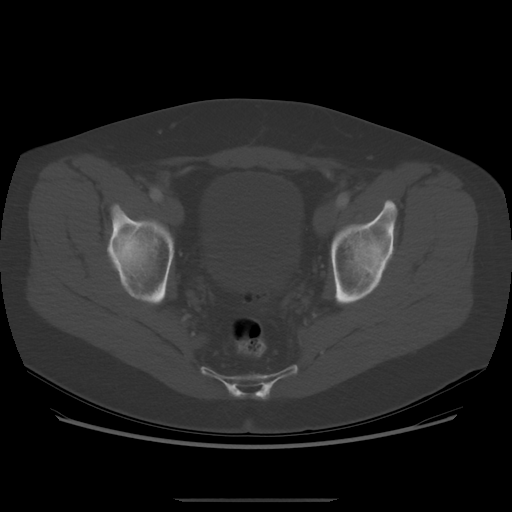
[im 19/85  soft-tissue]
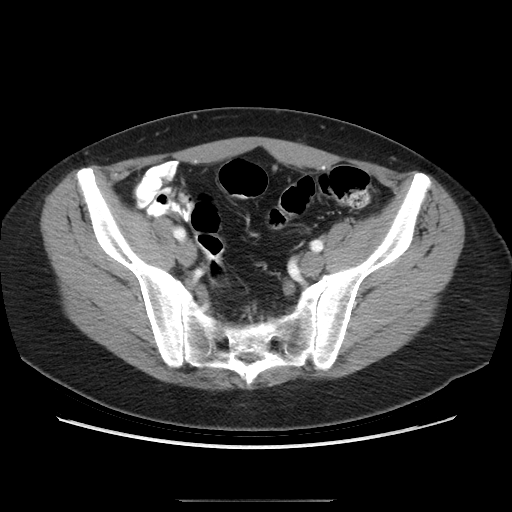
[im 29/85  soft-tissue]
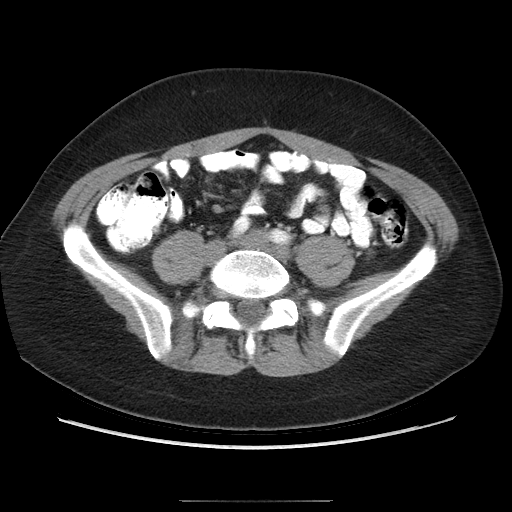
[im 38/85  soft-tissue]
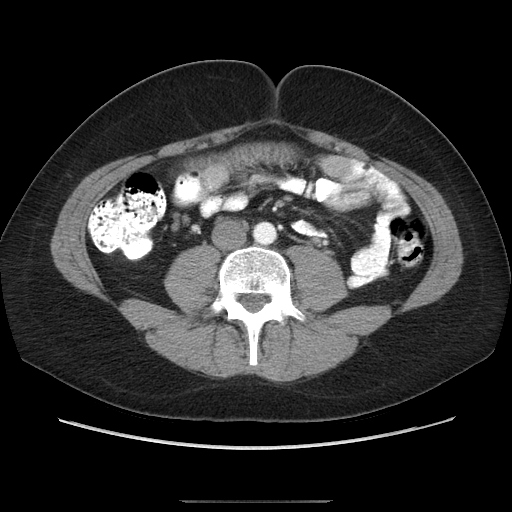
[im 47/85  soft-tissue]
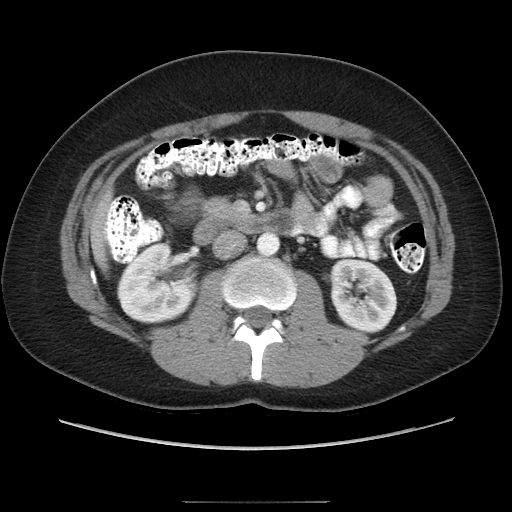
[im 47/85  lung]
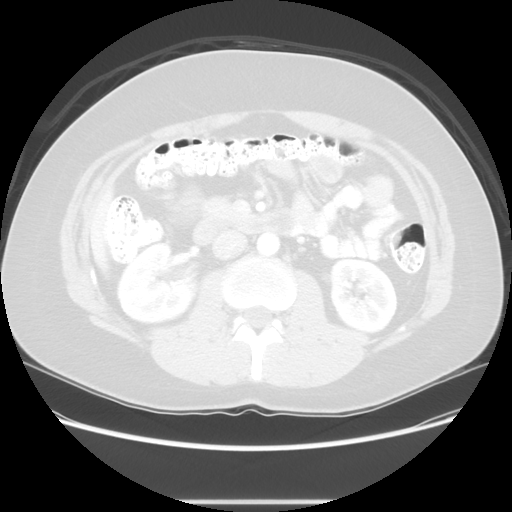
[im 57/85  soft-tissue]
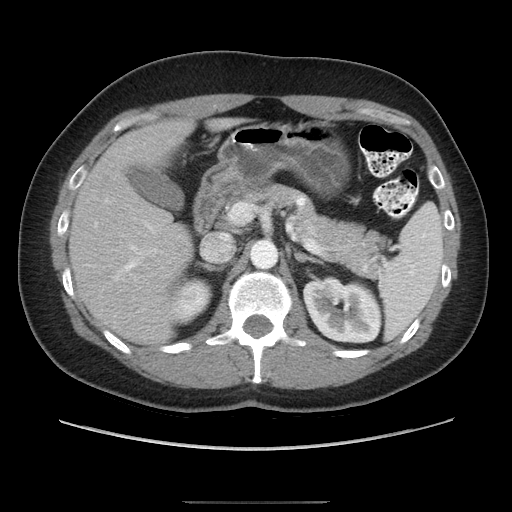
[im 57/85  lung]
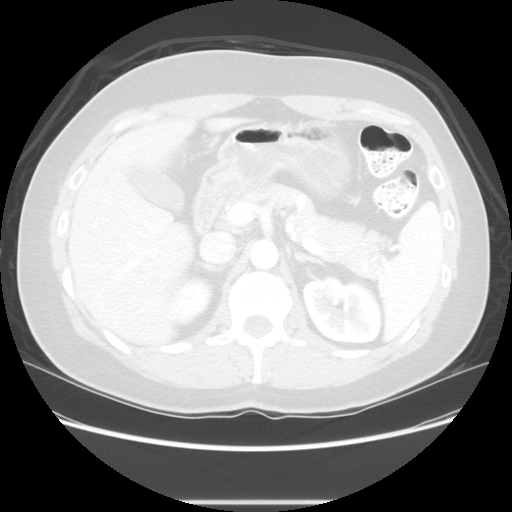
[im 66/85  soft-tissue]
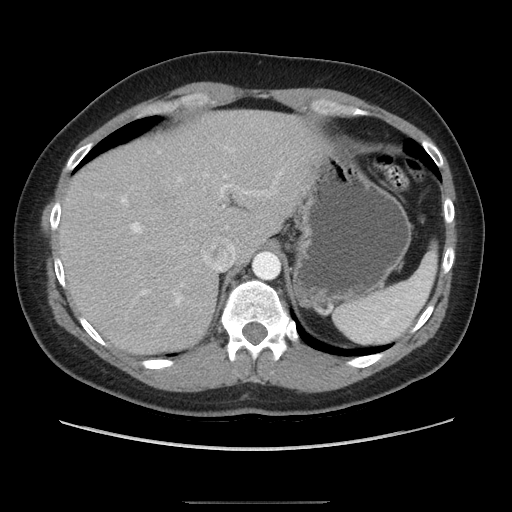
[im 66/85  lung]
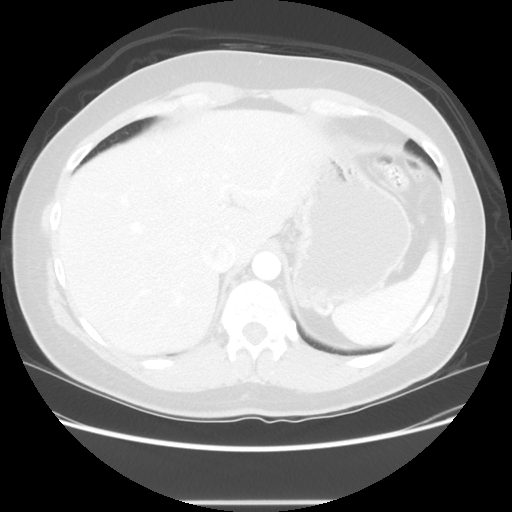
[im 75/85  soft-tissue]
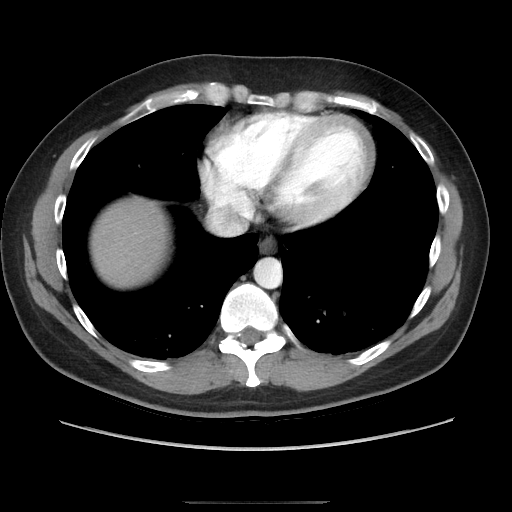
[im 75/85  lung]
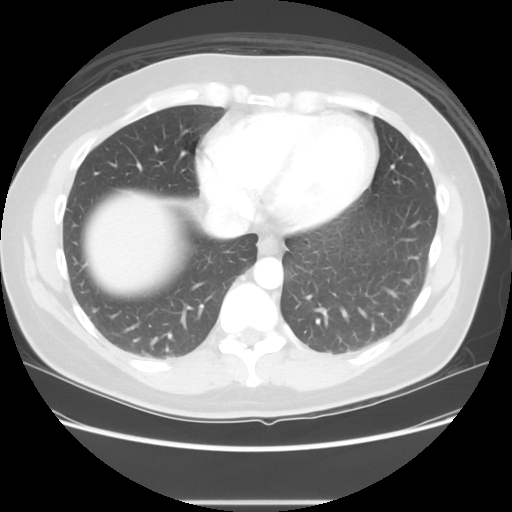

[Series 601: coronal body · coronal · 1.05mm/px · 1 of 120 slices shown, 2 images]
[im 40/120  soft-tissue]
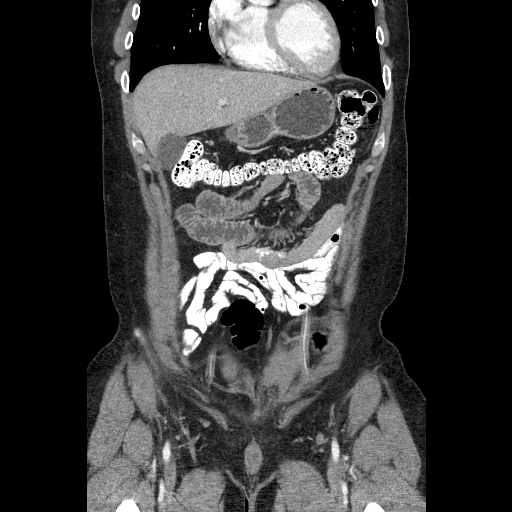
[im 40/120  bone]
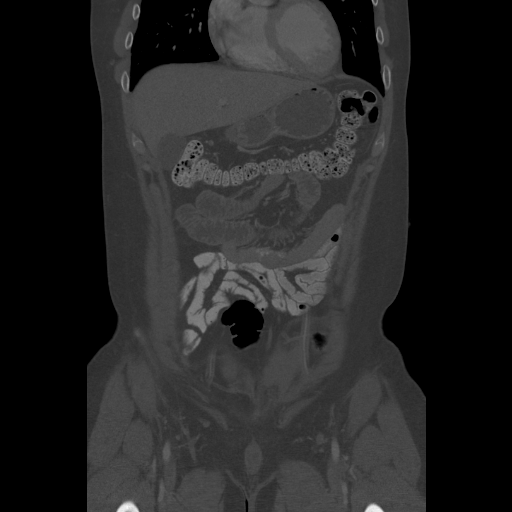

[9 of 36 positions shown; findings below may reference images not displayed]

FINDINGS: The lung bases are clear.  The liver enhances with no
focal abnormality and no ductal dilatation is seen.  No calcified
gallstones are noted.  The homogeneity of enhancement of the IVC is
due to mixture of opacified and nonopacified blood.  The pancreas
is normal in size and the pancreatic duct is not dilated.  The
adrenal glands and spleen are unremarkable.  The stomach is
moderately fluid distended with no abnormality noted.  The kidneys
enhance with small sub centimeter low attenuation structures
bilaterally most consistent with cysts.  No renal calculi are noted
and there is no evidence of hydronephrosis.  The abdominal aorta is
normal in caliber.

There are prominent lymph nodes within the right lower quadrant
which may indicate mesenteric adenitis.  The terminal ileum and the
appendix are unremarkable.  The urinary bladder is moderately urine
distended with no abnormality noted.  The prostate is normal in
size.  No groin adenopathy is seen.  There is no evidence of
recurrent inguinal hernia.  There is feces throughout colon but no
colonic lesion is evident.  The lumbar vertebrae are in normal
alignment.
IMPRESSION: 1.  No evidence of recurrent hernia.  No groin adenopathy.
2.  Somewhat prominent lymph nodes in the right lower quadrant may
indicate mesenteric adenitis.  Correlate clinically.
3.  The appendix and terminal ileum are unremarkable.

## 2014-04-14 ENCOUNTER — Encounter: Payer: Self-pay | Admitting: Family

## 2014-04-15 ENCOUNTER — Ambulatory Visit (INDEPENDENT_AMBULATORY_CARE_PROVIDER_SITE_OTHER): Payer: 59 | Admitting: Medical

## 2014-04-15 ENCOUNTER — Encounter: Payer: Self-pay | Admitting: Medical

## 2014-04-15 VITALS — BP 129/81 | HR 57 | Temp 98.0°F | Ht 74.0 in | Wt 190.8 lb

## 2014-04-15 DIAGNOSIS — J309 Allergic rhinitis, unspecified: Secondary | ICD-10-CM

## 2014-04-15 MED ORDER — AZITHROMYCIN 250 MG PO TABS: ORAL_TABLET | ORAL | Status: DC

## 2014-04-15 MED ORDER — LORATADINE 10 MG PO TABS: 10.0000 mg | ORAL_TABLET | Freq: Every day | ORAL | Status: DC

## 2014-04-15 MED ORDER — FLUTICASONE PROPIONATE 50 MCG/ACT NA SUSP: 2.0000 | Freq: Every day | NASAL | Status: DC

## 2014-04-15 NOTE — Patient Instructions (Signed)
Allergic rhinitis Recent early allergy symptoms and air travel. This appears to have lead to eustachian tube dysfunction and early OM bilateral.  Rx claritin and flonase nasal spray. Rx azithromycin antibiotic.  Follow up 7 days any persisting symptoms or as needed.

## 2014-04-15 NOTE — Progress Notes (Signed)
Pre visit review using our clinic review tool, if applicable. No additional management support is needed unless otherwise documented below in the visit note. 

## 2014-04-15 NOTE — Assessment & Plan Note (Signed)
Recent early allergy symptoms and air travel. This appears to have lead to eustachian tube dysfunction and early OM bilateral.  Rx claritin and flonase nasal spray. Rx azithromycin antibiotic.  Follow up 7 days any persisting symptoms or as needed.

## 2014-04-15 NOTE — Progress Notes (Signed)
Subjective:    Patient ID: Jon GaudierMatthew G Palmer, male    DOB: 1981-01-14, 34 y.o.   MRN: [id number]  HPI   Pt in with some rt ear pain. Pt states ear pressure for one week. Some flying recently with work.. Some nasal congestion as well. No sneezing or itching eyes.   Pt has some allergies this time of year in spring.  Some cough and pnd.  Mostly pressure sensation to rt tm is main symptom.   Review of Systems  Constitutional: Negative for fever, chills and fatigue.  HENT: Positive for congestion, ear pain and postnasal drip. Negative for sneezing.   Eyes: Negative for itching.  Respiratory: Positive for cough. Negative for choking, chest tightness, shortness of breath and wheezing.   Cardiovascular: Negative for chest pain and palpitations.  Musculoskeletal: Negative for back pain.  Neurological: Negative for dizziness, light-headedness, numbness and headaches.  Hematological: Negative for adenopathy. Does not bruise/bleed easily.  Psychiatric/Behavioral: Negative for behavioral problems and confusion.   Past Medical History  Diagnosis Date  . Allergy     allergic rhinitis  . Hernia     as an infant  . Sullivan LoneGilbert syndrome 03/30/2010    Qualifier: Diagnosis of  By: Peggyann Juba'Sullivan FNP, Katrinka BlazingMelissa S     History   Social History  . Marital Status: Married    Spouse Name: N/A  . Number of Children: 0  . 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 Smoking status: Former Smoker    Quit date: 02/07/2008  . Smokeless tobacco: Never Used  . Alcohol Use: Yes     Comment: 1-2 beers some days  . Drug Use: No  . Sexual Activity: Not on file   Other Topics Concern  . Not on file   Social History Narrative   regular exercise: yes   Male partner          Past Surgical History  Procedure Laterality Date  . Tonsillectomy      during college  . Uvulectomy  2006  . Hernia repair  11/2009    bilateral  .  Tympanostomy tube placement      as a child  . Wisdom tooth extraction      Family History  Problem Relation Age of Onset  . Hyperlipidemia Mother   . Hypertension Mother   . Hyperlipidemia Father   . Hypertension Father   . Cancer Father     bladder  . Heart disease Maternal Grandmother     multiple heart bypasses  . Heart attack Maternal Grandfather   . Heart disease Paternal Grandmother     multiple heart bypasses  . Dementia Paternal Grandfather     Allergies  Allergen Reactions  . Penicillins     REACTION: hives    No current outpatient prescriptions on file prior to visit.   No current facility-administered medications on file prior to visit.    BP 129/81 mmHg  Pulse 57  Temp(Src) 98 F (36.7 C) (Oral)  Ht 6\' 2"  (1.88 m)  Wt 190 lb 12.8 oz (86.546 kg)  BMI 24.49 kg/m2  SpO2 100%      Objective:   Physical Exam  General  Mental Status - Alert. General Appearance - Well groomed. Not in acute distress.  Skin Rashes- No Rashes.  HEENT Head- Normal. Ear Auditory Canal - Left- Normal. Right - Normal.Tympanic Membrane- Left- mid diffuse pinkish red  with faint pink color on periphery  Right- small bright red portion bottom of tm. Otherwise tm looked normal. No perforation seen. Eye Sclera/Conjunctiva- Left- Normal. Right- Normal. Nose & Sinuses Nasal Mucosa- Left-  Boggy and Congested. Right-  Boggy and  Congested.no  maxillary or  frontal sinus pressure. Mouth & Throat Lips: Upper Lip- Normal: no dryness, cracking, pallor, cyanosis, or vesicular eruption. Lower Lip-Normal: no dryness, cracking, pallor, cyanosis or vesicular eruption. Buccal Mucosa- Bilateral- No Aphthous ulcers. Oropharynx- No Discharge or Erythema. Tonsils: Characteristics- Bilateral- No Erythema or Congestion.+pnd Size/Enlargement- Bilateral- No enlargement. Discharge- bilateral-None.  Neck Neck- Supple. No Masses. Faint palpable but non tender submandibulare nodes.   Chest and  Lung Exam Auscultation: Breath Sounds:-Clear even and unlabored.  Cardiovascular Auscultation:Rythm- Regular, rate and rhythm. Murmurs & Other Heart Sounds:Ausculatation of the heart reveal- No Murmurs.  Lymphatic Head & Neck General Head & Neck Lymphatics: Bilateral: Description- No Localized lymphadenopathy.       Assessment & Plan:

## 2014-07-02 ENCOUNTER — Encounter: Payer: Self-pay | Admitting: Medical

## 2014-07-02 ENCOUNTER — Ambulatory Visit (INDEPENDENT_AMBULATORY_CARE_PROVIDER_SITE_OTHER): Payer: 59 | Admitting: Medical

## 2014-07-02 VITALS — BP 132/84 | HR 62 | Temp 98.2°F | Wt 194.0 lb

## 2014-07-02 DIAGNOSIS — J029 Acute pharyngitis, unspecified: Secondary | ICD-10-CM | POA: Diagnosis not present

## 2014-07-02 DIAGNOSIS — J020 Streptococcal pharyngitis: Secondary | ICD-10-CM | POA: Diagnosis not present

## 2014-07-02 LAB — POCT RAPID STREP A (OFFICE): Rapid Strep A Screen: POSITIVE — AB

## 2014-07-02 MED ORDER — AZITHROMYCIN 250 MG PO TABS: ORAL_TABLET | ORAL | Status: DC

## 2014-07-02 NOTE — Assessment & Plan Note (Signed)
Your strep test was positive. I am prescribing azithromycin antibiotic. Rest hydrate, tylenol for fever and warm salt water gargles. Follow up in 7 days or as needed. 

## 2014-07-02 NOTE — Progress Notes (Signed)
Pre visit review using our clinic review tool, if applicable. No additional management support is needed unless otherwise documented below in the visit note. 

## 2014-07-02 NOTE — Patient Instructions (Signed)
Pharyngitis, streptococcal, acute Your strep test was positive. I am prescribing azithromycin antibiotic. Rest hydrate, tylenol for fever and warm salt water gargles. Follow up in 7 days or as needed.

## 2014-07-02 NOTE — Progress Notes (Signed)
Subjective:    Patient ID: Jon Palmer, male    DOB: 16-Aug-1980, 34 y.o.   MRN: [id number]  HPI   Pt in with some st. Beginning of weak more sore. Now st better but he has feeling swollen and he feels fatigued. No fever but quick hot type flash today. No body aches.   Review of Systems  Constitutional: Positive for fatigue. Negative for fever and chills.  HENT: Positive for sore throat. Negative for congestion, ear pain, mouth sores, nosebleeds, rhinorrhea, sinus pressure and trouble swallowing.   Respiratory: Negative for cough, choking, chest tightness and wheezing.   Cardiovascular: Negative for chest pain and palpitations.  Gastrointestinal: Negative.  Negative for abdominal pain.  Musculoskeletal: Negative for back pain.  Neurological: Negative for dizziness, seizures, syncope, facial asymmetry, weakness, light-headedness, numbness and headaches.  Hematological: Negative for adenopathy. Does not bruise/bleed easily.  Psychiatric/Behavioral: Negative for behavioral problems and confusion.    Past Medical History  Diagnosis Date  . Allergy     allergic rhinitis  . Hernia     as an infant  . Sullivan LoneGilbert syndrome 03/30/2010    Qualifier: Diagnosis of  By: Peggyann Juba'Sullivan FNP, Katrinka BlazingMelissa S     History   Social History  . Marital Status: Married    Spouse Name: N/A  . Number of Children: 0  . 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 Smoking status: Former Smoker    Quit date: 02/07/2008  . Smokeless tobacco: Never Used  . Alcohol Use: Yes     Comment: 1-2 beers some days  . Drug Use: No  . Sexual Activity: Not on file   Other Topics Concern  . Not on file   Social History Narrative   regular exercise: yes   Male partner          Past Surgical History  Procedure Laterality Date  . Tonsillectomy      during college  . Uvulectomy  2006  . Hernia repair  11/2009    bilateral  .  Tympanostomy tube placement      as a child  . Wisdom tooth extraction      Family History  Problem Relation Age of Onset  . Hyperlipidemia Mother   . Hypertension Mother   . Hyperlipidemia Father   . Hypertension Father   . Cancer Father     bladder  . Heart disease Maternal Grandmother     multiple heart bypasses  . Heart attack Maternal Grandfather   . Heart disease Paternal Grandmother     multiple heart bypasses  . Dementia Paternal Grandfather     Allergies  Allergen Reactions  . Penicillins     REACTION: hives    Current Outpatient Prescriptions on File Prior to Visit  Medication Sig Dispense Refill  . fluticasone (FLONASE) 50 MCG/ACT nasal spray Place 2 sprays into both nostrils daily. 16 g 1  . loratadine (CLARITIN) 10 MG tablet Take 1 tablet (10 mg total) by mouth daily. 30 tablet 0  . azithromycin (ZITHROMAX) 250 MG tablet Take 2 tablets by mouth on day 1, followed by 1 tablet by mouth daily for 4 days. (Patient not taking: Reported on 07/02/2014) 6 tablet 0   No current facility-administered medications on file prior to visit.    BP 132/84 mmHg  Pulse 62  Temp(Src) 98.2 F (36.8 C)  Wt 194 lb (87.998 kg)  SpO2 99%       Objective:   Physical Exam   General- No acute distress, pleasant pt.  Neck- from, No nuccal rigidity, Mild submandibular node hypertrophy.  Lungs- Clear even and unlabored.  Heart- Regular, rate and rhythm. HEENT- Head- normocephalic Eyes- PEERL bilaterally. Ears- Canals clear, normal tm's bilaterally. Nose- No frontal or maxillary sinus tenderness to palpation. Turbinates normal. Throat- posterior pharynx shows  No   tonsillar hypertrophy plus,  erythma mild ,  discharge.   Neurologic- CN III- XII grossly intact.  Abdomen- soft, nontender. +bs, no rebound or guarding. No splenomegaly. Very faint tender left of umbilicus.      Assessment & Plan:

## 2014-07-22 ENCOUNTER — Ambulatory Visit (INDEPENDENT_AMBULATORY_CARE_PROVIDER_SITE_OTHER): Payer: 59 | Admitting: Medical

## 2014-07-22 ENCOUNTER — Encounter: Payer: Self-pay | Admitting: Medical

## 2014-07-22 VITALS — BP 127/79 | HR 60 | Temp 98.9°F | Ht 74.0 in | Wt 198.6 lb

## 2014-07-22 DIAGNOSIS — Z202 Contact with and (suspected) exposure to infections with a predominantly sexual mode of transmission: Secondary | ICD-10-CM | POA: Diagnosis not present

## 2014-07-22 DIAGNOSIS — D72819 Decreased white blood cell count, unspecified: Secondary | ICD-10-CM

## 2014-07-22 DIAGNOSIS — R5383 Other fatigue: Secondary | ICD-10-CM | POA: Diagnosis not present

## 2014-07-22 LAB — POCT URINALYSIS DIPSTICK
BILIRUBIN UA: NEGATIVE
Glucose, UA: NEGATIVE
KETONES UA: NEGATIVE
LEUKOCYTES UA: NEGATIVE
NITRITE UA: NEGATIVE
PH UA: 7
PROTEIN UA: NEGATIVE
RBC UA: NEGATIVE
Spec Grav, UA: 1.02
Urobilinogen, UA: 4

## 2014-07-22 NOTE — Assessment & Plan Note (Signed)
Etiolgy undetermined. Will get cbc, cmp, tsh, tick bite studies, hiv, ua and epstein bar antibody panel.

## 2014-07-22 NOTE — Progress Notes (Signed)
Pre visit review using our clinic review tool, if applicable. No additional management support is needed unless otherwise documented below in the visit note. 

## 2014-07-22 NOTE — Progress Notes (Signed)
Subjective:    Patient ID: Jon Palmer, male    DOB: [birth year], 34 y.o.   MRN: [id number]  HPI  Pt states just feeling fatigued. He had strep throat last time.(St resolved with antibiotic) but tired even before he had strep(He estimates fatigue for about one month). Tsh  last year normal,  mid low wbc count last year. Lft normal except known gilbert syndrome. Bmp was normal.  No known tick bites. But he does jog regular basis. So speculates maybe tick got on him but none found. Pt sleeping 7-8 hours. More than usual. But not napping during the day.  On review no fevers, no chills, no abdomen pain, no diarrhea, no urinary complaints. No reported abnormal lymph nodes   for further review see ros.      Review of Systems  Constitutional: Positive for fatigue. Negative for fever and chills.  HENT: Negative.   Respiratory: Negative for cough and chest tightness.   Cardiovascular: Negative for chest pain and palpitations.  Gastrointestinal: Negative for vomiting, abdominal pain, diarrhea, constipation and blood in stool.  Endocrine: Negative.   Genitourinary: Negative.   Musculoskeletal: Negative for myalgias, back pain, arthralgias, gait problem, neck pain and neck stiffness.  Skin: Negative for rash.  Neurological: Negative for dizziness, seizures, syncope, facial asymmetry, speech difficulty, weakness and headaches.  Hematological: Negative for adenopathy. Does not bruise/bleed easily.  Psychiatric/Behavioral: Negative for behavioral problems, confusion and dysphoric mood. The patient is not nervous/anxious.    Past Medical History  Diagnosis Date  . Allergy     allergic rhinitis  . Hernia     as an infant  . 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 Marital Status: Married    Spouse Name: N/A  . Number of Children: 0  . Years of Education: N/A   Occupational History  . [employer]  Publishing copy   Social History Main Topics  . Smoking status: Former Smoker    Quit date: 02/07/2008  . Smokeless tobacco: Never Used  . Alcohol Use: Yes     Comment: 1-2 beers some days  . Drug Use: No  . Sexual Activity: Not on file   Other Topics Concern  . Not on file   Social History Narrative   regular exercise: yes   Male partner          Past Surgical History  Procedure Laterality Date  . Tonsillectomy      during college  . Uvulectomy  2006  . Hernia repair  11/2009    bilateral  . Tympanostomy tube placement      as a child  . Wisdom tooth extraction      Family History  Problem Relation Age of Onset  . Hyperlipidemia Mother   . Hypertension Mother   . Hyperlipidemia Father   . Hypertension Father   . Cancer Father     bladder  . Heart disease Maternal Grandmother     multiple heart bypasses  . Heart attack Maternal Grandfather   . Heart disease Paternal Grandmother     multiple heart bypasses  . Dementia Paternal Grandfather     Allergies  Allergen Reactions  . Penicillins     REACTION: hives    Current Outpatient Prescriptions on File Prior to Visit  Medication Sig Dispense Refill  . fluticasone (FLONASE) 50 MCG/ACT nasal spray Place 2 sprays  into both nostrils daily. 16 g 1  . loratadine (CLARITIN) 10 MG tablet Take 1 tablet (10 mg total) by mouth daily. 30 tablet 0   No current facility-administered medications on file prior to visit.    BP 127/79 mmHg  Pulse 60  Temp(Src) 98.9 F (37.2 C) (Oral)  Ht  (1.88 m)  Wt 198 lb 9.6 oz (90.084 kg)  BMI 25.49 kg/m2  SpO2 100%       Objective:   Physical Exam   General  Mental Status - Alert. General Appearance - Well groomed. Not in acute distress.  Skin Rashes- No Rashes.  HEENT Head- Normal. Ear Auditory Canal - Left- Normal. Right - Normal.Tympanic Membrane- Left- Normal. Right- Normal. Eye Sclera/Conjunctiva- Left- Normal. Right- Normal. Nose & Sinuses  Nasal Mucosa- Left-  Not boggy or Congested. Right-  Not  boggy or Congested. Mouth & Throat Lips: Upper Lip- Normal: no dryness, cracking, pallor, cyanosis, or vesicular eruption. Lower Lip-Normal: no dryness, cracking, pallor, cyanosis or vesicular eruption. Buccal Mucosa- Bilateral- No Aphthous ulcers. Oropharynx- No Discharge or Erythema. Tonsils: Characteristics- Bilateral- No Erythema or Congestion. Size/Enlargement- Bilateral- No enlargement. Discharge- bilateral-None.  Neck Neck- Supple. No Masses.   Chest and Lung Exam Auscultation: Breath Sounds:- even and unlabored  Cardiovascular Auscultation:Rythm- Regular, rate and rhythm. Murmurs & Other Heart Sounds:Ausculatation of the heart reveal- No Murmurs.  Lymphatic Head & Neck General Head & Neck Lymphatics: Bilateral: Description- No Localized lymphadenopathy.    Abdomen Inspection:-Inspection Normal.  Palpation/Perucssion: Palpation and Percussion of the abdomen reveal- Non Tender, No Rebound tenderness, No rigidity(Guarding) and No Palpable abdominal masses.  Liver:-Normal.  Spleen:- Normal.   Back- no cva tenderness.          Assessment & Plan:

## 2014-07-22 NOTE — Patient Instructions (Addendum)
Fatigue Etiolgy undetermined. Will get cbc, cmp, tsh, tick bite studies, hiv, ua and epstein bar antibody panel.    Will call you with results then proceed from there.  Follow up in 10-14 days or as needed.

## 2014-07-23 ENCOUNTER — Other Ambulatory Visit: Payer: 59

## 2014-07-23 LAB — CBC WITH DIFFERENTIAL/PLATELET
BASOS ABS: 0 10*3/uL (ref 0.0–0.1)
BASOS PCT: 0.3 % (ref 0.0–3.0)
EOS PCT: 1.9 % (ref 0.0–5.0)
Eosinophils Absolute: 0.1 10*3/uL (ref 0.0–0.7)
HCT: 41.5 % (ref 39.0–52.0)
Hemoglobin: 14.1 g/dL (ref 13.0–17.0)
LYMPHS PCT: 44.4 % (ref 12.0–46.0)
Lymphs Abs: 2.2 10*3/uL (ref 0.7–4.0)
MCHC: 33.9 g/dL (ref 30.0–36.0)
MCV: 86.9 fl (ref 78.0–100.0)
MONOS PCT: 9.6 % (ref 3.0–12.0)
Monocytes Absolute: 0.5 10*3/uL (ref 0.1–1.0)
NEUTROS ABS: 2.2 10*3/uL (ref 1.4–7.7)
Neutrophils Relative %: 43.8 % (ref 43.0–77.0)
Platelets: 193 10*3/uL (ref 150.0–400.0)
RBC: 4.78 Mil/uL (ref 4.22–5.81)
RDW: 13.9 % (ref 11.5–15.5)
WBC: 5 10*3/uL (ref 4.0–10.5)

## 2014-07-23 LAB — COMPREHENSIVE METABOLIC PANEL
ALT: 21 U/L (ref 0–53)
AST: 22 U/L (ref 0–37)
Albumin: 4.5 g/dL (ref 3.5–5.2)
Alkaline Phosphatase: 56 U/L (ref 39–117)
BILIRUBIN TOTAL: 0.7 mg/dL (ref 0.2–1.2)
BUN: 18 mg/dL (ref 6–23)
CO2: 26 meq/L (ref 19–32)
Calcium: 9.3 mg/dL (ref 8.4–10.5)
Chloride: 106 mEq/L (ref 96–112)
Creatinine, Ser: 0.96 mg/dL (ref 0.40–1.50)
GFR: 95.51 mL/min (ref >60.00)
Glucose, Bld: 92 mg/dL (ref 70–99)
Potassium: 4 mEq/L (ref 3.5–5.1)
SODIUM: 137 meq/L (ref 135–145)
Total Protein: 7.6 g/dL (ref 6.0–8.3)

## 2014-07-23 LAB — TSH: TSH: 2.17 u[IU]/mL (ref 0.35–4.50)

## 2014-07-24 LAB — EPSTEIN-BARR VIRUS VCA ANTIBODY PANEL
EBV EA IgG: 12.7 U/mL — ABNORMAL HIGH (ref <9.0)
EBV NA IgG: >600 U/mL — ABNORMAL HIGH (ref <18.0)
EBV VCA IgG: >750 U/mL — ABNORMAL HIGH (ref <18.0)
EBV VCA IgM: <10 U/mL (ref <36.0)

## 2014-07-24 LAB — URINE CULTURE
Colony Count: NO GROWTH
Organism ID, Bacteria: NO GROWTH

## 2014-07-25 LAB — HIV ANTIBODY (ROUTINE TESTING W REFLEX): HIV 1&2 Ab, 4th Generation: NONREACTIVE

## 2014-07-27 LAB — ROCKY MTN SPOTTED FVR ABS PNL(IGG+IGM)
RMSF IgG: 0.18 IV
RMSF IgM: 0.45 IV

## 2014-07-29 LAB — LYME ABY, WSTRN BLT IGG & IGM W/BANDS
B BURGDORFERI IGG ABS (IB): NEGATIVE
B burgdorferi IgM Abs (IB): NEGATIVE
LYME DISEASE 28 KD IGG: NONREACTIVE
LYME DISEASE 41 KD IGM: NONREACTIVE
LYME DISEASE 45 KD IGG: NONREACTIVE
Lyme Disease 18 kD IgG: NONREACTIVE
Lyme Disease 23 kD IgG: NONREACTIVE
Lyme Disease 23 kD IgM: NONREACTIVE
Lyme Disease 30 kD IgG: NONREACTIVE
Lyme Disease 39 kD IgG: NONREACTIVE
Lyme Disease 39 kD IgM: NONREACTIVE
Lyme Disease 41 kD IgG: NONREACTIVE
Lyme Disease 58 kD IgG: NONREACTIVE
Lyme Disease 66 kD IgG: NONREACTIVE
Lyme Disease 93 kD IgG: NONREACTIVE

## 2014-07-30 ENCOUNTER — Telehealth: Payer: Self-pay | Admitting: Medical

## 2014-07-30 NOTE — Telephone Encounter (Signed)
I did not order a drug screen on Mr. Jon Palmer. Not sure why this was done. Pt was seen late in the day and he came back for labs(I believe the next day). I think when he came back following day it was run by accident. He should not be charged.

## 2014-08-05 ENCOUNTER — Telehealth: Payer: Self-pay | Admitting: Family

## 2014-08-05 NOTE — Telephone Encounter (Signed)
See mychart.  

## 2014-10-29 ENCOUNTER — Encounter: Payer: Self-pay | Admitting: Behavioral Health

## 2014-10-29 ENCOUNTER — Telehealth: Payer: Self-pay | Admitting: Behavioral Health

## 2014-10-29 NOTE — Telephone Encounter (Signed)
Pre-Visit Call completed with patient and chart updated.   Pre-Visit Info documented in Specialty Comments under Snap[employer]ot.    

## 2014-10-30 ENCOUNTER — Encounter: Payer: Self-pay | Admitting: Family

## 2014-10-30 ENCOUNTER — Ambulatory Visit (INDEPENDENT_AMBULATORY_CARE_PROVIDER_SITE_OTHER): Payer: 59 | Admitting: Family

## 2014-10-30 VITALS — BP 110/70 | HR 62 | Temp 98.2°F | Resp 16 | Ht 74.0 in | Wt 210.4 lb

## 2014-10-30 DIAGNOSIS — Z23 Encounter for immunization: Secondary | ICD-10-CM

## 2014-10-30 DIAGNOSIS — F40243 Fear of flying: Secondary | ICD-10-CM | POA: Diagnosis not present

## 2014-10-30 DIAGNOSIS — Z0000 Encounter for general adult medical examination without abnormal findings: Secondary | ICD-10-CM

## 2014-10-30 LAB — LIPID PANEL
CHOL/HDL RATIO: 3
Cholesterol: 164 mg/dL (ref 0–200)
HDL: 57.9 mg/dL (ref >39.00)
LDL CALC: 96 mg/dL (ref 0–99)
NONHDL: 106.2
Triglycerides: 51 mg/dL (ref 0.0–149.0)
VLDL: 10.2 mg/dL (ref 0.0–40.0)

## 2014-10-30 LAB — URINALYSIS, ROUTINE W REFLEX MICROSCOPIC
BILIRUBIN URINE: NEGATIVE
Hgb urine dipstick: NEGATIVE
Ketones, ur: NEGATIVE
LEUKOCYTES UA: NEGATIVE
Nitrite: NEGATIVE
PH: 7.5 (ref 5.0–8.0)
SPECIFIC GRAVITY, URINE: 1.02 (ref 1.000–1.030)
TOTAL PROTEIN, URINE-UPE24: NEGATIVE
UROBILINOGEN UA: 0.2 (ref 0.0–1.0)
Urine Glucose: NEGATIVE
WBC, UA: NONE SEEN (ref 0–?)

## 2014-10-30 LAB — BASIC METABOLIC PANEL
BUN: 18 mg/dL (ref 6–23)
CHLORIDE: 105 meq/L (ref 96–112)
CO2: 27 mEq/L (ref 19–32)
CREATININE: 1 mg/dL (ref 0.40–1.50)
Calcium: 9.2 mg/dL (ref 8.4–10.5)
GFR: 90.97 mL/min (ref >60.00)
GLUCOSE: 90 mg/dL (ref 70–99)
POTASSIUM: 4.2 meq/L (ref 3.5–5.1)
Sodium: 139 mEq/L (ref 135–145)

## 2014-10-30 MED ORDER — ALPRAZOLAM 0.5 MG PO TABS: ORAL_TABLET | ORAL | Status: DC

## 2014-10-30 NOTE — Patient Instructions (Addendum)
Please complete lab work prior to leaving. (Including UDS) Follow up in 1 year.

## 2014-10-30 NOTE — Assessment & Plan Note (Signed)
Discussed healthy diet, exercise.  Tdap and flu shot today. Obtain routine labs.

## 2014-10-30 NOTE — Progress Notes (Signed)
Pre visit review using our clinic review tool, if applicable. No additional management support is needed unless otherwise documented below in the visit note. 

## 2014-10-30 NOTE — Assessment & Plan Note (Signed)
rx provided for xanax [facility] flights. UDS today and controlled substance contract is signed.

## 2014-10-30 NOTE — Addendum Note (Signed)
Addended by: Mervin Kung A on: 10/30/2014 12:02 PM   Modules accepted: Orders

## 2014-10-30 NOTE — Progress Notes (Signed)
Subjective:    Patient ID: Jon Palmer, male    DOB: 1980/10/29, 34 y.o.   MRN: [id number]  HPI  Jon Palmer is a 34 yr old male who presents today for cpx.  Patient presents today for complete physical.  Immunizations: tetanus 2016 Diet: fair Exercise: treadmill, gym Wt Readings from Last 3 Encounters:  10/30/14 210 lb 6.4 oz (95.437 kg)  07/22/14 198 lb 9.6 oz (90.084 kg)  07/02/14 194 lb (87.998 kg)  Has been gaining weight back. Attributes to stress and not making healthy dietary choices, exercising less.  Travelling more. Had work up early this summer for fatigue.  Work up was extensive and was negative.  He attributes his fatigue to stress.  Has anxiety about flying, requests met to take prior to fling.  Vision: due for eye exam Dental: up to date    Review of Systems  Constitutional: Positive for unexpected weight change.  HENT: Negative for hearing loss and rhinorrhea.   Eyes: Negative for visual disturbance.  Respiratory: Negative for cough.   Cardiovascular: Negative for leg swelling.  Gastrointestinal: Negative for diarrhea and constipation.  Genitourinary: Negative for dysuria and frequency.  Musculoskeletal: Negative for myalgias and arthralgias.  Skin: Negative for rash.  Neurological: Negative for headaches.  Hematological: Negative for adenopathy.  Psychiatric/Behavioral:       See HPI   Past Medical History  Diagnosis Date  . Allergy     allergic rhinitis  . Hernia     as an infant  . Rosanna Randy syndrome 03/30/2010    Qualifier: Diagnosis of  By: Inda Castle FNP, [place]     Social History   Social History  . Marital Status: Married    Spouse Name: N/A  . Number of Children: 0  . Years of Education: N/A   Occupational History  . [employer] Radio producer   Social History Main Topics  . Smoking status: Former Smoker    Quit date: 02/07/2008  . Smokeless tobacco: Never Used  . Alcohol Use: Yes     Comment: 1-2 beers some days  .  Drug Use: No  . Sexual Activity: Not on file   Other Topics Concern  . Not on file   Social History Narrative   regular exercise: yes   Male partner          Past Surgical History  Procedure Laterality Date  . Tonsillectomy      during college  . Uvulectomy  2006  . Hernia repair  11/2009    bilateral  . Tympanostomy tube placement      as a child  . Wisdom tooth extraction      Family History  Problem Relation Age of Onset  . Hyperlipidemia Mother   . Hypertension Mother   . Hyperlipidemia Father   . Hypertension Father   . Cancer Father     bladder  . Heart disease Maternal Grandmother     multiple heart bypasses  . Heart attack Maternal Grandfather   . Heart disease Paternal Grandmother     multiple heart bypasses  . Dementia Paternal Grandfather     Allergies  Allergen Reactions  . Penicillins     REACTION: hives    No current outpatient prescriptions on file prior to visit.   No current facility-administered medications on file prior to visit.    BP 110/70 mmHg  Pulse 62  Temp(Src) 98.2 F (36.8 C) (Oral)  Resp 16  Ht $R'6\' 2"'HH$  (  1.88 m)  Wt 210 lb 6.4 oz (95.437 kg)  BMI 27.00 kg/m2  SpO2 98%       Objective:   Physical Exam  Physical Exam  Constitutional: He is oriented to person, place, and time. He appears well-developed and well-nourished. No distress.  HENT:  Head: Normocephalic and atraumatic.  Right Ear: Tympanic membrane and ear canal normal.  Left Ear: Tympanic membrane and ear canal normal.  Mouth/Throat: Oropharynx is clear and moist.  Eyes: Pupils are equal, round, and reactive to light. No scleral icterus.  Neck: Normal range of motion. No thyromegaly present.  Cardiovascular: Normal rate and regular rhythm.   No murmur heard. Pulmonary/Chest: Effort normal and breath sounds normal. No respiratory distress. He has no wheezes. He has no rales. He exhibits no tenderness.  Abdominal: Soft. Bowel sounds are normal. He exhibits  no distension and no mass. There is no tenderness. There is no rebound and no guarding.  Musculoskeletal: He exhibits no edema.  Lymphadenopathy:    He has no cervical adenopathy.  Neurological: He is alert and oriented to person, place, and time. He has normal  Patellar reflexes. He exhibits normal muscle tone. Coordination normal.  Skin: Skin is warm and dry.  Psychiatric: He has a normal mood and affect. His behavior is normal. Judgment and thought content normal.          Assessment & Plan:        Assessment & Plan:  Fatigue- suspect some mild situational depression (he states that his company may be closing in 1.5 years).  We discussed referral to a therapist, he declines at this time. Doesn't feel that his symptoms are severe enough to warrant rx.

## 2014-11-05 ENCOUNTER — Encounter: Payer: Self-pay | Admitting: Family

## 2014-11-17 ENCOUNTER — Telehealth: Payer: Self-pay | Admitting: Family

## 2014-11-17 NOTE — Telephone Encounter (Signed)
Form updated and forwarded to PCP for signature.

## 2014-11-17 NOTE — Telephone Encounter (Signed)
Received blank biometrics form. Need to know days / minutes per week pt exercises and pt's waist circumference.  Sent mychart message to pt. Awaiting response to complete form.

## 2014-11-17 NOTE — Telephone Encounter (Signed)
Please advise. I haven't seen this form. Thanks, JG//CMA

## 2014-11-17 NOTE — Telephone Encounter (Signed)
Melissa do you have this form?

## 2014-11-17 NOTE — Telephone Encounter (Signed)
Unable to locate form. Left detailed message asking pt to fax [place] another copy to nurse station, attn: Nicki Guadal[place]. Awaiting fax / call.

## 2014-11-17 NOTE — Telephone Encounter (Signed)
Relation to ZO:XWRU Call back number:[phone]   Reason for call:  Patient checking on the status of physical form patient dropped off 10/30/2014 at physical appointment and the deadline is Friday. Please advise patient on the status

## 2014-11-17 NOTE — Telephone Encounter (Signed)
Pt emailed form to Tiffany to print and give to provider

## 2014-11-17 NOTE — Telephone Encounter (Signed)
I completed form, and I believe I put it in Jon Palmer's folder.  If we cannot find, can we please request copy and I will fill out again?

## 2014-11-18 NOTE — Telephone Encounter (Signed)
Form completed, signed and faxed to [facility] @ [phone].

## 2014-12-05 ENCOUNTER — Encounter: Payer: Self-pay | Admitting: Family

## 2014-12-07 MED ORDER — ALPRAZOLAM 0.5 MG PO TABS
ORAL_TABLET | ORAL | Status: AC
Start: 2014-12-07 — End: ?

## 2014-12-07 NOTE — Telephone Encounter (Signed)
Rx faxed to pharmacy  

## 2014-12-07 NOTE — Telephone Encounter (Signed)
See rx. 

## 2015-03-03 ENCOUNTER — Encounter: Payer: Self-pay | Admitting: Medical

## 2015-03-03 ENCOUNTER — Ambulatory Visit (INDEPENDENT_AMBULATORY_CARE_PROVIDER_SITE_OTHER): Payer: 59 | Admitting: Medical

## 2015-03-03 VITALS — BP 110/76 | HR 67 | Temp 98.0°F | Ht 74.0 in | Wt 230.0 lb

## 2015-03-03 DIAGNOSIS — R0981 Nasal congestion: Secondary | ICD-10-CM

## 2015-03-03 DIAGNOSIS — R059 Cough, unspecified: Secondary | ICD-10-CM

## 2015-03-03 DIAGNOSIS — J029 Acute pharyngitis, unspecified: Secondary | ICD-10-CM | POA: Diagnosis not present

## 2015-03-03 DIAGNOSIS — R05 Cough: Secondary | ICD-10-CM

## 2015-03-03 LAB — POCT RAPID STREP A (OFFICE): Rapid Strep A Screen: NEGATIVE

## 2015-03-03 MED ORDER — AZITHROMYCIN 250 MG PO TABS: ORAL_TABLET | ORAL | Status: DC

## 2015-03-03 MED ORDER — BENZONATATE 100 MG PO CAPS: 100.0000 mg | ORAL_CAPSULE | Freq: Three times a day (TID) | ORAL | Status: AC | PRN

## 2015-03-03 MED ORDER — FLUTICASONE PROPIONATE 50 MCG/ACT NA SUSP: 2.0000 | Freq: Every day | NASAL | Status: AC

## 2015-03-03 MED FILL — AZITHROMYCIN 250 MG TABLET: 250 | 5 days supply | Qty: 6 | Fill #0

## 2015-03-03 MED FILL — BENZONATATE 100 MG CAPSULE: 100 | 10 days supply | Qty: 30 | Fill #0

## 2015-03-03 NOTE — Progress Notes (Signed)
Pre visit review using our clinic review tool, if applicable. No additional management support is needed unless otherwise documented below in the visit note. 

## 2015-03-03 NOTE — Addendum Note (Signed)
Addended by: Neldon Labella on: 03/03/2015 05:21 PM   Modules accepted: Orders

## 2015-03-03 NOTE — Progress Notes (Signed)
   Subjective:    Patient ID: Jon Palmer, male    DOB: 01-15-1981, 35 y.o.   MRN: [id number]  HPI   Pt in for sore throat and other respiratory type symptoms as well.  Symptoms last couple of days. This am moderate st. Worse in the morning. Mild ha yesterday. No body aches. Mild fatigue. No known strep exposure. But some sick coworkers.  Som nasal congestion and cough last 24 hours.  No fever, no chills or sweats.  Pt did use some flonase this am and did seem to help.   Some congestion.  Pt feels pnd,.  Mild cough today. Mild phlem.   Review of Systems  Constitutional: Negative for fever, chills and fatigue.  HENT: Positive for congestion, postnasal drip and sore throat. Negative for sinus pressure, trouble swallowing and voice change.   Eyes: Negative for pain.  Respiratory: Positive for cough. Negative for chest tightness.        Mild cough. Some productive.  Cardiovascular: Negative for chest pain and palpitations.  Gastrointestinal: Negative for nausea, vomiting, abdominal pain, diarrhea and constipation.  Neurological: Negative for dizziness, seizures, syncope, weakness, numbness and headaches.  Hematological: Negative for adenopathy. Does not bruise/bleed easily.  Psychiatric/Behavioral: Negative for behavioral problems.       Objective:   Physical Exam   General  Mental Status - Alert. General Appearance - Well groomed. Not in acute distress.  Skin Rashes- No Rashes.  HEENT Head- Normal. Ear Auditory Canal - Left- Normal. Right - Normal.Tympanic Membrane- Left- mid central redness. Right- Normal. Eye Sclera/Conjunctiva- Left- Normal. Right- Normal. Nose & Sinuses Nasal Mucosa- Left-  Boggy and Congested. Right-  Boggy and  Congested.Bilateral  No maxillary and  No frontal sinus pressure. Mouth & Throat Lips: Upper Lip- Normal: no dryness, cracking, pallor, cyanosis, or vesicular eruption. Lower Lip-Normal: no dryness, cracking, pallor, cyanosis or  vesicular eruption. Buccal Mucosa- Bilateral- No Aphthous ulcers. Oropharynx- No Discharge or Erythema. +pnd. Tonsils: Characteristics- Bilateral- No Erythema or Congestion. Size/Enlargement- Bilateral- No enlargement. Discharge- bilateral-None.  Neck Neck- Supple. No Masses. No lymphadenopathy.   Chest and Lung Exam Auscultation: Breath Sounds:-Clear even and unlabored.  Cardiovascular Auscultation:Rythm- Regular, rate and rhythm. Murmurs & Other Heart Sounds:Ausculatation of the heart reveal- No Murmurs.  Lymphatic Head & Neck General Head & Neck Lymphatics: Bilateral: Description- No Localized lymphadenopathy.       Assessment & Plan:  Your rapid strep test was negative.  Symptoms recently may represent early uri vs allergic rhinitis type symptoms(pnd can cause st)  For nasal congestion refill your flonase. For cough prescription of benzonatate.  If you get worsening st, bronchitis symptoms or left ear pain then I am making azithromycin available.  Follow up 7 days or as needed

## 2015-03-03 NOTE — Patient Instructions (Addendum)
Your rapid strep test was negative.  Symptoms recently may represent early uri vs allergic rhinitis type symptoms(pnd can cause st)  For nasal congestion refill your flonase. For cough prescription of benzonatate.  If you get worsening st, bronchitis symptoms or left ear pain then I am making azithromycin available.  Follow up 7 days or as needed

## 2015-03-19 ENCOUNTER — Ambulatory Visit (INDEPENDENT_AMBULATORY_CARE_PROVIDER_SITE_OTHER): Payer: 59 | Admitting: Family Medicine

## 2015-03-19 ENCOUNTER — Encounter: Payer: Self-pay | Admitting: Family Medicine

## 2015-03-19 VITALS — BP 108/80 | HR 94 | Temp 98.4°F | Ht 74.0 in | Wt 229.7 lb

## 2015-03-19 DIAGNOSIS — J069 Acute upper respiratory infection, unspecified: Secondary | ICD-10-CM

## 2015-03-19 NOTE — Progress Notes (Signed)
HPI:  URI -started: yesterday -symptoms:nasal congestion, sore throat, cough, sinus pressure -denies:fever, SOB, NVD, tooth pain, body aches -has tried: humidifier -sick contacts/travel/risks: denies flu exposure -Hx of: allergies  ROS: See pertinent positives and negatives per HPI.  Past Medical History  Diagnosis Date  . Allergy     allergic rhinitis  . Hernia     as an infant  . 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 Tonsillectomy      during college  . Uvulectomy  2006  . Hernia repair  11/2009    bilateral  . Tympanostomy tube placement      as a child  . Wisdom tooth extraction      Family History  Problem Relation Age of Onset  . Hyperlipidemia Mother   . Hypertension Mother   . Hyperlipidemia Father   . Hypertension Father   . Cancer Father     bladder  . Heart disease Maternal Grandmother     multiple heart bypasses  . Heart attack Maternal Grandfather   . Heart disease Paternal Grandmother     multiple heart bypasses  . Dementia Paternal Grandfather     Social History   Social History  . Marital Status: Married    Spouse Name: N/A  . Number of Children: 0  . 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 Smoking status: Former Smoker    Quit date: 02/07/2008  . Smokeless tobacco: Never Used  . Alcohol Use: Yes     Comment: 1-2 beers some days  . Drug Use: No  . Sexual Activity: Not Asked   Other Topics Concern  . None   Social History Narrative   regular exercise: yes   Male partner           Current outpatient prescriptions:  .  ALPRAZolam (XANAX) 0.5 MG tablet, Take 1 tablet by mouth prior to flying., Disp: 10 tablet, Rfl: 0 .  benzonatate (TESSALON) 100 MG capsule, Take 1 capsule (100 mg total) by mouth 3 (three) times daily as needed., Disp: 30  capsule, Rfl: 0 .  fluticasone (FLONASE) 50 MCG/ACT nasal spray, Place 2 sprays into both nostrils daily., Disp: 16 g, Rfl: 1  EXAM:  Filed Vitals:   03/19/15 1353  BP: 108/80  Pulse: 94  Temp: 98.4 F (36.9 C)    Body mass index is 29.48 kg/(m^2).  GENERAL: vitals reviewed and listed above, alert, oriented, appears well hydrated and in no acute distress  HEENT: atraumatic, conjunttiva clear, no obvious abnormalities on inspection of external nose and ears, normal appearance of ear canals and TMs, clear nasal congestion, mild post oropharyngeal erythema with PND, no tonsillar edema or exudate, no sinus TTP  NECK: no obvious masses on inspection  LUNGS: clear to auscultation bilaterally, no wheezes, rales or rhonchi, good air movement  CV: HRRR, no peripheral edema  MS: moves all extremities without noticeable abnormality  PSYCH: pleasant and cooperative, no obvious depression or anxiety  ASSESSMENT AND PLAN:  Discussed the following assessment and plan:  Acute upper respiratory infection  -given HPI and exam findings today, a serious infection or illness is unlikely. We discussed potential etiologies, with VURI being most likely, and advised supportive care and monitoring. We discussed treatment side effects, likely course, antibiotic misuse,  transmission, and signs of developing a serious illness. -of course, we advised to return or notify a doctor immediately if symptoms worsen or persist or new concerns arise.    There are no Patient Instructions on file for this visit.   Kriste Basque R.

## 2015-03-19 NOTE — Patient Instructions (Signed)
Patient declined AVS 

## 2015-03-19 NOTE — Progress Notes (Signed)
Pre visit review using our clinic review tool, if applicable. No additional management support is needed unless otherwise documented below in the visit note. 

## 2015-12-14 ENCOUNTER — Encounter: Payer: Self-pay | Admitting: Family

## 2015-12-14 DIAGNOSIS — Z7252 High risk homosexual behavior: Secondary | ICD-10-CM

## 2015-12-20 ENCOUNTER — Telehealth: Payer: Self-pay | Admitting: Pharmacist Clinician (PhC)/ Clinical Pharmacy Specialist

## 2015-12-20 NOTE — Telephone Encounter (Signed)
Research staff referred Molli HazardMatthew to us to call about PreP because his PCP is not comfortable doing it. After talking to him, [place] He was worried about the cost of office visit and such. Explained to him about the whole process for PreP and that the charge will be there regardless of where he goes. Told [facility] since he lives much closer. He is going to do that.

## 2015-12-20 NOTE — Telephone Encounter (Signed)
[facility] [place] [facility] dept also are rx PrEP MD visit is scaled down based on income

## 2015-12-21 NOTE — Telephone Encounter (Signed)
thx Minh!

## 2015-12-21 NOTE — Telephone Encounter (Signed)
Called him back today to let him know about the [facility].

## 2016-08-07 ENCOUNTER — Telehealth: Payer: Self-pay | Admitting: *Deleted

## 2016-08-07 NOTE — Telephone Encounter (Signed)
[facility], [place] [place]; forwarded to [place] for email/scan/SLS 07/02
